# Patient Record
Sex: Female | Born: 1955
Health system: Southern US, Community
[De-identification: ages and names within clinical notes are randomized; demographics above are authoritative.]

## PROBLEM LIST (undated history)

## (undated) DIAGNOSIS — T7840XA Allergy, unspecified, initial encounter: Secondary | ICD-10-CM

## (undated) DIAGNOSIS — G4730 Sleep apnea, unspecified: Secondary | ICD-10-CM

## (undated) DIAGNOSIS — M1990 Unspecified osteoarthritis, unspecified site: Secondary | ICD-10-CM

## (undated) DIAGNOSIS — H409 Unspecified glaucoma: Secondary | ICD-10-CM

## (undated) DIAGNOSIS — E739 Lactose intolerance, unspecified: Secondary | ICD-10-CM

## (undated) DIAGNOSIS — I10 Essential (primary) hypertension: Secondary | ICD-10-CM

## (undated) DIAGNOSIS — I519 Heart disease, unspecified: Secondary | ICD-10-CM

## (undated) DIAGNOSIS — H269 Unspecified cataract: Secondary | ICD-10-CM

## (undated) DIAGNOSIS — C569 Malignant neoplasm of unspecified ovary: Secondary | ICD-10-CM

## (undated) DIAGNOSIS — K219 Gastro-esophageal reflux disease without esophagitis: Secondary | ICD-10-CM

## (undated) DIAGNOSIS — R011 Cardiac murmur, unspecified: Secondary | ICD-10-CM

## (undated) HISTORY — DX: Unspecified osteoarthritis, unspecified site: M19.90

## (undated) HISTORY — DX: Gastro-esophageal reflux disease without esophagitis: K21.9

## (undated) HISTORY — DX: Cardiac murmur, unspecified: R01.1

## (undated) HISTORY — DX: Allergy, unspecified, initial encounter: T78.40XA

## (undated) HISTORY — DX: Unspecified cataract: H26.9

## (undated) HISTORY — DX: Lactose intolerance, unspecified: E73.9

## (undated) HISTORY — PX: OTHER SURGICAL HISTORY: SHX169

## (undated) HISTORY — DX: Heart disease, unspecified: I51.9

## (undated) HISTORY — DX: Unspecified glaucoma: H40.9

## (undated) HISTORY — DX: Sleep apnea, unspecified: G47.30

## (undated) HISTORY — PX: COLONOSCOPY: SHX174

---

## 2014-08-12 ENCOUNTER — Encounter (HOSPITAL_COMMUNITY): Payer: Self-pay | Admitting: Adult Health

## 2014-08-12 ENCOUNTER — Emergency Department (HOSPITAL_COMMUNITY): Payer: Self-pay

## 2014-08-12 ENCOUNTER — Emergency Department (HOSPITAL_COMMUNITY)
Admission: EM | Admit: 2014-08-12 | Discharge: 2014-08-13 | Disposition: A | Discharge status: Home / Self Care | Payer: Self-pay | Attending: Emergency Medicine | Admitting: Emergency Medicine

## 2014-08-12 DIAGNOSIS — S8991XA Unspecified injury of right lower leg, initial encounter: Secondary | ICD-10-CM | POA: Insufficient documentation

## 2014-08-12 DIAGNOSIS — M25561 Pain in right knee: Secondary | ICD-10-CM

## 2014-08-12 DIAGNOSIS — Z8543 Personal history of malignant neoplasm of ovary: Secondary | ICD-10-CM | POA: Insufficient documentation

## 2014-08-12 DIAGNOSIS — W19XXXA Unspecified fall, initial encounter: Secondary | ICD-10-CM

## 2014-08-12 DIAGNOSIS — S20211A Contusion of right front wall of thorax, initial encounter: Secondary | ICD-10-CM | POA: Insufficient documentation

## 2014-08-12 DIAGNOSIS — Y9389 Activity, other specified: Secondary | ICD-10-CM | POA: Insufficient documentation

## 2014-08-12 DIAGNOSIS — I10 Essential (primary) hypertension: Secondary | ICD-10-CM | POA: Insufficient documentation

## 2014-08-12 DIAGNOSIS — Y998 Other external cause status: Secondary | ICD-10-CM | POA: Insufficient documentation

## 2014-08-12 DIAGNOSIS — Y92009 Unspecified place in unspecified non-institutional (private) residence as the place of occurrence of the external cause: Secondary | ICD-10-CM | POA: Insufficient documentation

## 2014-08-12 DIAGNOSIS — W01198A Fall on same level from slipping, tripping and stumbling with subsequent striking against other object, initial encounter: Secondary | ICD-10-CM | POA: Insufficient documentation

## 2014-08-12 HISTORY — DX: Essential (primary) hypertension: I10

## 2014-08-12 HISTORY — DX: Malignant neoplasm of unspecified ovary: C56.9

## 2014-08-12 LAB — CBC WITH DIFFERENTIAL/PLATELET
BASOS ABS: 0 10*3/uL (ref 0.0–0.1)
Basophils Relative: 0 % (ref 0–1)
Eosinophils Absolute: 0.2 10*3/uL (ref 0.0–0.7)
Eosinophils Relative: 3 % (ref 0–5)
HEMATOCRIT: 37.8 % (ref 36.0–46.0)
Hemoglobin: 12.3 g/dL (ref 12.0–15.0)
LYMPHS PCT: 50 % — AB (ref 12–46)
Lymphs Abs: 3.2 10*3/uL (ref 0.7–4.0)
MCH: 30.1 pg (ref 26.0–34.0)
MCHC: 32.5 g/dL (ref 30.0–36.0)
MCV: 92.6 fL (ref 78.0–100.0)
MONO ABS: 0.5 10*3/uL (ref 0.1–1.0)
MONOS PCT: 8 % (ref 3–12)
Neutro Abs: 2.5 10*3/uL (ref 1.7–7.7)
Neutrophils Relative %: 39 % — ABNORMAL LOW (ref 43–77)
PLATELETS: 254 10*3/uL (ref 150–400)
RBC: 4.08 MIL/uL (ref 3.87–5.11)
RDW: 12.6 % (ref 11.5–15.5)
WBC: 6.5 10*3/uL (ref 4.0–10.5)

## 2014-08-12 MED ORDER — HYDROCODONE-ACETAMINOPHEN 5-325 MG PO TABS
2.0000 | ORAL_TABLET | Freq: Once | ORAL | Status: AC
Start: 2014-08-13 — End: 2014-08-12
  Administered 2014-08-12: 2 via ORAL
  Filled 2014-08-12: qty 2

## 2014-08-12 MED ORDER — AMLODIPINE BESYLATE 5 MG PO TABS
5.0000 mg | ORAL_TABLET | Freq: Once | ORAL | Status: AC
  Administered 2014-08-12: 5 mg via ORAL
  Filled 2014-08-12: qty 1

## 2014-08-12 MED ORDER — HYDROCHLOROTHIAZIDE 25 MG PO TABS
25.0000 mg | ORAL_TABLET | ORAL | Status: AC
  Administered 2014-08-12: 25 mg via ORAL
  Filled 2014-08-12: qty 1

## 2014-08-12 NOTE — ED Notes (Signed)
Presents post fall from [place] states she tripped and fell and landed on her right side and her right leg went underneath her. She c/o worsening pain in right knee and right rib cage, especially with cough, laugh and deep breathing. Chest expansion symmetrical, edema noted to right knee. Pt is HTN at 209/99.

## 2014-08-12 NOTE — ED Provider Notes (Signed)
CSN: [id number]     Arrival date & time 08/12/14  2209 History  This chart was scribed for Kalman Drape, MD by Delphia Grates, ED Scribe. This patient was seen in room D33C/D33C and the patient's care was started at 11:39 PM.    Chief Complaint  Patient presents with  . Fall    The history is provided by the patient. No language interpreter was used.     HPI Comments: Lilu Mcglown is a 59 y.o. female who presents to the Emergency Department complaining of fall that occurred 3 days ago. Patient states she tripped and fell, landing on her right side. She denies head injury or LOC. She notes associated pain to right knee and right ribs. Patient is also complaining of SOB. She denies any other injuries or history of asthma. She has history of HTN and has a triage BP of 209/99 and 236/100. Patient states she has not seen a PCP in 2 years, and is noncompliant with her BP medications.    Past Medical History  Diagnosis Date  . Hypertension   . Ovary cancer    History reviewed. No pertinent past surgical history. History reviewed. No pertinent family history. History  Substance Use Topics  . Smoking status: Never Smoker   . Smokeless tobacco: Not on file  . Alcohol Use: No   OB History    No data available     Review of Systems  Respiratory: Positive for shortness of breath.   Musculoskeletal: Positive for myalgias and arthralgias.  All other systems reviewed and are negative.     Allergies  Review of patient's allergies indicates no known allergies.  Home Medications   Prior to Admission medications   Not on File   Triage Vitals: BP 209/99 mmHg  Pulse 75  Temp(Src) 97.9 F (36.6 C) (Oral)  Resp 18  SpO2 98%  Physical Exam  Constitutional: She is oriented to person, place, and time. She appears well-developed and well-nourished. No distress.  HENT:  Head: Normocephalic and atraumatic.  Nose: Nose normal.  Mouth/Throat: Oropharynx is clear and moist.  Eyes:  Conjunctivae and EOM are normal. Pupils are equal, round, and reactive to light.  Neck: Normal range of motion. Neck supple. No JVD present. No tracheal deviation present. No thyromegaly present.  Cardiovascular: Normal rate, regular rhythm, normal heart sounds and intact distal pulses.  Exam reveals no gallop and no friction rub.   No murmur heard. Pulmonary/Chest: Effort normal and breath sounds normal. No stridor. No respiratory distress. She has no wheezes. She has no rales. She exhibits tenderness (tenderness along right axillar line without step-off or crepitus or bruising).  Patient with expiratory wheezes when laying flat on the bed, when sitting up, deep breathing, wheezing has resolved.  Abdominal: Soft. Bowel sounds are normal. She exhibits no distension and no mass. There is no tenderness. There is no rebound and no guarding.  Musculoskeletal: Normal range of motion. She exhibits tenderness (tenderness to palpation over inferior pole of patella.  Small effusion.  No crepitus, no varus or valgus laxity, negative anterior posterior drawer.  No contusion noted.  Negative Lachman's). She exhibits no edema.  Lymphadenopathy:    She has no cervical adenopathy.  Neurological: She is alert and oriented to person, place, and time. She displays normal reflexes. She exhibits normal muscle tone. Coordination normal.  Skin: Skin is warm and dry. No rash noted. No erythema. No pallor.  Psychiatric: She has a normal mood and affect. Her behavior is  normal. Judgment and thought content normal.  Nursing note and vitals reviewed.   ED Course  Procedures (including critical care time)  DIAGNOSTIC STUDIES: Oxygen Saturation is 98% on room air, normal by my interpretation.    COORDINATION OF CARE: At 2346 Discussed treatment plan with patient which includes pain medication. Patient agrees.   Labs Review Labs Reviewed  BASIC METABOLIC PANEL - Abnormal; Notable for the following:    Glucose, Bld 114  (*)    GFR calc non Af Amer 63 (*)    GFR calc Af Amer 74 (*)    Anion gap 4 (*)    All other components within normal limits  CBC WITH DIFFERENTIAL/PLATELET - Abnormal; Notable for the following:    Neutrophils Relative % 39 (*)    Lymphocytes Relative 50 (*)    All other components within normal limits  URINALYSIS, ROUTINE W REFLEX MICROSCOPIC    Imaging Review Dg Chest 2 View  08/12/2014   CLINICAL DATA:  Status post fall 4 days ago. Right axillary and rib pain.  EXAM: CHEST  2 VIEW  COMPARISON:  None.  FINDINGS: The heart size and mediastinal contours are within normal limits. Both lungs are clear. The visualized skeletal structures are unremarkable.  IMPRESSION: No active cardiopulmonary disease.   Electronically Signed   By: Kathreen Devoid   On: 08/12/2014 22:52   Dg Knee Complete 4 Views Right  08/12/2014   CLINICAL DATA:  Status post fall, right knee pain  EXAM: RIGHT KNEE - COMPLETE 4+ VIEW  COMPARISON:  None.  FINDINGS: No acute fracture or dislocation. No lytic or sclerotic osseous lesion. Mild osteoarthritis of the patellofemoral compartment and medial femorotibial compartment. No joint effusion.  IMPRESSION: No acute osseous injury of the right knee.   Electronically Signed   By: Kathreen Devoid   On: 08/12/2014 22:53     EKG Interpretation   Date/Time:  Tuesday August 12 2014 23:34:53 EST Ventricular Rate:  58 PR Interval:  146 QRS Duration: 76 QT Interval:  415 QTC Calculation: 408 R Axis:   49 Text Interpretation:  Sinus rhythm Abnormal R-wave progression, early  transition Abnormal T, consider ischemia, diffuse leads No old tracing to  compare Confirmed by Zackory Pudlo  MD, Roel Douthat ([id number]) on 08/13/2014 12:06:11 AM      MDM   Final diagnoses:  Fall at home, initial encounter  Knee pain, acute, right  Chest wall contusion, right, initial encounter  Essential hypertension    59 year old female with mechanical fall on Saturday with persistent right knee and right lateral  chest wall tenderness.  Patient has history of hypertension, has been noncompliant with medication for some time.  She denies any chest pain.  She does have some shortness of breath secondary to pain with deep breathing.  EKG unremarkable.  Labs pending.  Plan for pain medicine and restarted on prior hypertension medications. I personally performed the services described in this documentation, which was scribed in my presence. The recorded information has been reviewed and is accurate.     Kalman Drape, MD 08/13/14 [phone]

## 2014-08-12 NOTE — ED Notes (Addendum)
Pt reports falling Saturday; residual pain in right ribs and right knee. Pt states she has been able to ambulate without any difficulties. Pt denies LOC after fall.

## 2014-08-13 LAB — URINALYSIS, ROUTINE W REFLEX MICROSCOPIC
BILIRUBIN URINE: NEGATIVE
Glucose, UA: NEGATIVE mg/dL
Hgb urine dipstick: NEGATIVE
Ketones, ur: NEGATIVE mg/dL
Leukocytes, UA: NEGATIVE
Nitrite: NEGATIVE
PH: 6.5 (ref 5.0–8.0)
PROTEIN: NEGATIVE mg/dL
Specific Gravity, Urine: 1.021 (ref 1.005–1.030)
Urobilinogen, UA: 1 mg/dL (ref 0.0–1.0)

## 2014-08-13 LAB — BASIC METABOLIC PANEL
Anion gap: 4 — ABNORMAL LOW (ref 5–15)
BUN: 15 mg/dL (ref 6–23)
CALCIUM: 9.1 mg/dL (ref 8.4–10.5)
CO2: 28 mmol/L (ref 19–32)
Chloride: 107 mmol/L (ref 96–112)
Creatinine, Ser: 0.96 mg/dL (ref 0.50–1.10)
GFR calc non Af Amer: 63 mL/min — ABNORMAL LOW (ref >90)
GFR, EST AFRICAN AMERICAN: 74 mL/min — AB (ref >90)
GLUCOSE: 114 mg/dL — AB (ref 70–99)
Potassium: 4.1 mmol/L (ref 3.5–5.1)
SODIUM: 139 mmol/L (ref 135–145)

## 2014-08-13 MED ORDER — HYDROCODONE-ACETAMINOPHEN 5-325 MG PO TABS: 1.0000 | ORAL_TABLET | ORAL | Status: DC | PRN

## 2014-08-13 MED ORDER — AMLODIPINE BESYLATE 5 MG PO TABS: 5.0000 mg | ORAL_TABLET | Freq: Every day | ORAL | Status: DC

## 2014-08-13 MED ORDER — HYDROCHLOROTHIAZIDE 25 MG PO TABS: 25.0000 mg | ORAL_TABLET | Freq: Every day | ORAL | Status: DC

## 2014-08-13 MED ORDER — CLONIDINE HCL 0.1 MG PO TABS
0.1000 mg | ORAL_TABLET | Freq: Once | ORAL | Status: AC
  Administered 2014-08-13: 0.1 mg via ORAL
  Filled 2014-08-13: qty 1

## 2014-08-13 NOTE — Discharge Instructions (Signed)
Take medication as prescribed.  Please follow-up with a local primary care doctor who can manage your blood pressure.  It is very important for you to take your blood pressure medication to avoid complications of high blood pressure, such as heart attack, stroke, kidney failure.   Chest Contusion A contusion is a deep bruise. Bruises happen when an injury causes bleeding under the skin. Signs of bruising include pain, puffiness (swelling), and discolored skin. The bruise may turn blue, purple, or yellow.  HOME CARE  Put ice on the injured area.  Put ice in a plastic bag.  Place a towel between the skin and the bag.  Leave the ice on for 15-20 minutes at a time, 03-04 times a day for the first 48 hours.  Only take medicine as told by your doctor.  Rest.  Take deep breaths (deep-breathing exercises) as told by your doctor.  Stop smoking if you smoke.  Do not lift objects over 5 pounds (2.3 kilograms) for 3 days or longer if told by your doctor. GET HELP RIGHT AWAY IF:   You have more bruising or puffiness.  You have pain that gets worse.  You have trouble breathing.  You are dizzy, weak, or pass out (faint).  You have blood in your pee (urine) or poop (stool).  You cough up or throw up (vomit) blood.  Your puffiness or pain is not helped with medicines. MAKE SURE YOU:   Understand these instructions.  Will watch your condition.  Will get help right away if you are not doing well or get worse. Document Released: 12/14/2007 Document Revised: 03/21/2012 Document Reviewed: 12/19/2011 [facility] Patient Information 2015 [place], [place]. This information is not intended to replace advice given to you by your health care provider. Make sure you discuss any questions you have with your health care provider.  DASH Eating Plan DASH stands for "Dietary Approaches to Stop Hypertension." The DASH eating plan is a healthy eating plan that has been shown to reduce high blood pressure  (hypertension). Additional health benefits may include reducing the risk of type 2 diabetes mellitus, heart disease, and stroke. The DASH eating plan may also help with weight loss. WHAT DO I NEED TO KNOW ABOUT THE DASH EATING PLAN? For the DASH eating plan, you will follow these general guidelines:  Choose foods with a percent daily value for sodium of less than 5% (as listed on the food label).  Use salt-free seasonings or herbs instead of table salt or sea salt.  Check with your health care provider or pharmacist before using salt substitutes.  Eat lower-sodium products, often labeled as "lower sodium" or "no salt added."  Eat fresh foods.  Eat more vegetables, fruits, and low-fat dairy products.  Choose whole grains. Look for the word "whole" as the first word in the ingredient list.  Choose fish and skinless chicken or [place] more often than red meat. Limit fish, poultry, and meat to 6 oz (170 g) each day.  Limit sweets, desserts, sugars, and sugary drinks.  Choose heart-healthy fats.  Limit cheese to 1 oz (28 g) per day.  Eat more home-cooked food and less restaurant, buffet, and fast food.  Limit fried foods.  Cook foods using methods other than frying.  Limit canned vegetables. If you do use them, rinse them well to decrease the sodium.  When eating at a restaurant, ask that your food be prepared with less salt, or no salt if possible. WHAT FOODS CAN I EAT? Seek help from  a dietitian for individual calorie needs. Grains Whole grain or whole wheat bread. Brown rice. Whole grain or whole wheat pasta. Quinoa, bulgur, and whole grain cereals. Low-sodium cereals. Corn or whole wheat flour tortillas. Whole grain cornbread. Whole grain crackers. Low-sodium crackers. Vegetables Fresh or frozen vegetables (raw, steamed, roasted, or grilled). Low-sodium or reduced-sodium tomato and vegetable juices. Low-sodium or reduced-sodium tomato sauce and paste. Low-sodium or  reduced-sodium canned vegetables.  Fruits All fresh, canned (in natural juice), or frozen fruits. Meat and Other Protein Products Ground beef (85% or leaner), grass-fed beef, or beef trimmed of fat. Skinless chicken or [place]. Ground chicken or [place]. Pork trimmed of fat. All fish and seafood. Eggs. Dried beans, peas, or lentils. Unsalted nuts and seeds. Unsalted canned beans. Dairy Low-fat dairy products, such as skim or 1% milk, 2% or reduced-fat cheeses, low-fat ricotta or cottage cheese, or plain low-fat yogurt. Low-sodium or reduced-sodium cheeses. Fats and Oils Tub margarines without trans fats. Light or reduced-fat mayonnaise and salad dressings (reduced sodium). Avocado. Safflower, olive, or canola oils. Natural peanut or almond butter. Other Unsalted popcorn and pretzels. The items listed above may not be a complete list of recommended foods or beverages. Contact your dietitian for more options. WHAT FOODS ARE NOT RECOMMENDED? Grains White bread. White pasta. White rice. Refined cornbread. Bagels and croissants. Crackers that contain trans fat. Vegetables Creamed or fried vegetables. Vegetables in a cheese sauce. Regular canned vegetables. Regular canned tomato sauce and paste. Regular tomato and vegetable juices. Fruits Dried fruits. Canned fruit in light or heavy syrup. Fruit juice. Meat and Other Protein Products Fatty cuts of meat. Ribs, chicken wings, bacon, sausage, bologna, salami, chitterlings, fatback, hot dogs, bratwurst, and packaged luncheon meats. Salted nuts and seeds. Canned beans with salt. Dairy Whole or 2% milk, cream, half-and-half, and cream cheese. Whole-fat or sweetened yogurt. Full-fat cheeses or blue cheese. Nondairy creamers and whipped toppings. Processed cheese, cheese spreads, or cheese curds. Condiments Onion and garlic salt, seasoned salt, table salt, and sea salt. Canned and packaged gravies. Worcestershire sauce. Tartar sauce. Barbecue sauce. Teriyaki  sauce. Soy sauce, including reduced sodium. Steak sauce. Fish sauce. Oyster sauce. Cocktail sauce. Horseradish. Ketchup and mustard. Meat flavorings and tenderizers. Bouillon cubes. Hot sauce. Tabasco sauce. Marinades. Taco seasonings. Relishes. Fats and Oils Butter, stick margarine, lard, shortening, ghee, and bacon fat. Coconut, palm kernel, or palm oils. Regular salad dressings. Other Pickles and olives. Salted popcorn and pretzels. The items listed above may not be a complete list of foods and beverages to avoid. Contact your dietitian for more information. WHERE CAN I FIND MORE INFORMATION? [employer], [employer], [employer]: travelstabloid.com Document Released: 06/16/2011 Document Revised: 11/11/2013 Document Reviewed: 05/01/2013 [facility] Patient Information 2015 [place], [place]. This information is not intended to replace advice given to you by your health care provider. Make sure you discuss any questions you have with your health care provider.  Fall Prevention and Home Safety Falls cause injuries and can affect all age groups. It is possible to use preventive measures to significantly decrease the likelihood of falls. There are many simple measures which can make your home safer and prevent falls. OUTDOORS  Repair cracks and edges of walkways and driveways.  Remove high doorway thresholds.  Trim shrubbery on the main path into your home.  Have good outside lighting.  Clear walkways of tools, rocks, debris, and clutter.  Check that handrails are not broken and are securely fastened. Both sides of steps should have handrails.  Have leaves,  snow, and ice cleared regularly.  Use sand or salt on walkways during winter months.  In the garage, clean up grease or oil spills. BATHROOM  Install night lights.  Install grab bars by the toilet and in the tub and shower.  Use non-skid mats or decals in the tub or shower.  Place a plastic  non-slip stool in the shower to sit on, if needed.  Keep floors dry and clean up all water on the floor immediately.  Remove soap buildup in the tub or shower on a regular basis.  Secure bath mats with non-slip, double-sided rug tape.  Remove throw rugs and tripping hazards from the floors. BEDROOMS  Install night lights.  Make sure a bedside light is easy to reach.  Do not use oversized bedding.  Keep a telephone by your bedside.  Have a firm chair with side arms to use for getting dressed.  Remove throw rugs and tripping hazards from the floor. KITCHEN  Keep handles on pots and pans turned toward the center of the stove. Use back burners when possible.  Clean up spills quickly and allow time for drying.  Avoid walking on wet floors.  Avoid hot utensils and knives.  Position shelves so they are not too high or low.  Place commonly used objects within easy reach.  If necessary, use a sturdy step stool with a grab bar when reaching.  Keep electrical cables out of the way.  Do not use floor polish or wax that makes floors slippery. If you must use wax, use non-skid floor wax.  Remove throw rugs and tripping hazards from the floor. STAIRWAYS  Never leave objects on stairs.  Place handrails on both sides of stairways and use them. Fix any loose handrails. Make sure handrails on both sides of the stairways are as long as the stairs.  Check carpeting to make sure it is firmly attached along stairs. Make repairs to worn or loose carpet promptly.  Avoid placing throw rugs at the top or bottom of stairways, or properly secure the rug with carpet tape to prevent slippage. Get rid of throw rugs, if possible.  Have an electrician put in a light switch at the top and bottom of the stairs. OTHER FALL PREVENTION TIPS  Wear low-heel or rubber-soled shoes that are supportive and fit well. Wear closed toe shoes.  When using a stepladder, make sure it is fully opened and both  spreaders are firmly locked. Do not climb a closed stepladder.  Add color or contrast paint or tape to grab bars and handrails in your home. Place contrasting color strips on first and last steps.  Learn and use mobility aids as needed. Install an electrical emergency response system.  Turn on lights to avoid dark areas. Replace light bulbs that burn out immediately. Get light switches that glow.  Arrange furniture to create clear pathways. Keep furniture in the same place.  Firmly attach carpet with non-skid or double-sided tape.  Eliminate uneven floor surfaces.  Select a carpet pattern that does not visually hide the edge of steps.  Be aware of all pets. OTHER HOME SAFETY TIPS  Set the water temperature for 120 F (48.8 C).  Keep emergency numbers on or near the telephone.  Keep smoke detectors on every level of the home and near sleeping areas. Document Released: 06/17/2002 Document Revised: 12/27/2011 Document Reviewed: 09/16/2011 [facility] Patient Information 2015 [place], [place]. This information is not intended to replace advice given to you by your health care  provider. Make sure you discuss any questions you have with your health care provider.  Hypertension Hypertension is another name for high blood pressure. High blood pressure forces your heart to work harder to pump blood. A blood pressure reading has two numbers, which includes a higher number over a lower number (example: 110/72). HOME CARE   Have your blood pressure rechecked by your doctor.  Only take medicine as told by your doctor. Follow the directions carefully. The medicine does not work as well if you skip doses. Skipping doses also puts you at risk for problems.  Do not smoke.  Monitor your blood pressure at home as told by your doctor. GET HELP IF:  You think you are having a reaction to the medicine you are taking.  You have repeat headaches or feel dizzy.  You have puffiness (swelling) in your  ankles.  You have trouble with your vision. GET HELP RIGHT AWAY IF:   You get a very bad headache and are confused.  You feel weak, numb, or faint.  You get chest or belly (abdominal) pain.  You throw up (vomit).  You cannot breathe very well. MAKE SURE YOU:   Understand these instructions.  Will watch your condition.  Will get help right away if you are not doing well or get worse. Document Released: 12/14/2007 Document Revised: 07/02/2013 Document Reviewed: 04/19/2013 [facility] Patient Information 2015 [place], [place]. This information is not intended to replace advice given to you by your health care provider. Make sure you discuss any questions you have with your health care provider.  Knee Pain Knee pain can be a result of an injury or other medical conditions. Treatment will depend on the cause of your pain. HOME CARE  Only take medicine as told by your doctor.  Keep a healthy weight. Being overweight can make the knee hurt more.  Stretch before exercising or playing sports.  If there is constant knee pain, change the way you exercise. Ask your doctor for advice.  Make sure shoes fit well. Choose the right shoe for the sport or activity.  Protect your knees. Wear kneepads if needed.  Rest when you are tired. GET HELP RIGHT AWAY IF:   Your knee pain does not stop.  Your knee pain does not get better.  Your knee joint feels hot to the touch.  You have a fever. MAKE SURE YOU:   Understand these instructions.  Will watch this condition.  Will get help right away if you are not doing well or get worse. Document Released: 09/23/2008 Document Revised: 09/19/2011 Document Reviewed: 09/23/2008 [facility] Patient Information 2015 [place], [place]. This information is not intended to replace advice given to you by your health care provider. Make sure you discuss any questions you have with your health care provider.  Managing Your High Blood Pressure Blood pressure  is a measurement of how forceful your blood is pressing against the walls of the arteries. Arteries are muscular tubes within the circulatory system. Blood pressure does not stay the same. Blood pressure rises when you are active, excited, or nervous; and it lowers during sleep and relaxation. If the numbers measuring your blood pressure stay above normal most of the time, you are at risk for health problems. High blood pressure (hypertension) is a long-term (chronic) condition in which blood pressure is elevated. A blood pressure reading is recorded as two numbers, such as 120 over 80 (or 120/80). The first, higher number is called the systolic pressure. It is a  measure of the pressure in your arteries as the heart beats. The second, lower number is called the diastolic pressure. It is a measure of the pressure in your arteries as the heart relaxes between beats.  Keeping your blood pressure in a normal range is important to your overall health and prevention of health problems, such as heart disease and stroke. When your blood pressure is uncontrolled, your heart has to work harder than normal. High blood pressure is a very common condition in adults because blood pressure tends to rise with age. Men and women are equally likely to have hypertension but at different times in life. Before age 58, men are more likely to have hypertension. After 60 years of age, women are more likely to have it. Hypertension is especially common in African Americans. This condition often has no signs or symptoms. The cause of the condition is usually not known. Your caregiver can help you come up with a plan to keep your blood pressure in a normal, healthy range. BLOOD PRESSURE STAGES Blood pressure is classified into four stages: normal, prehypertension, stage 1, and stage 2. Your blood pressure reading will be used to determine what type of treatment, if any, is necessary. Appropriate treatment options are tied to these four  stages:  Normal  Systolic pressure (mm Hg): below 120.  Diastolic pressure (mm Hg): below 80. Prehypertension  Systolic pressure (mm Hg): 120 to 139.  Diastolic pressure (mm Hg): 80 to 89. Stage1  Systolic pressure (mm Hg): 140 to 159.  Diastolic pressure (mm Hg): 90 to 99. Stage2  Systolic pressure (mm Hg): 160 or above.  Diastolic pressure (mm Hg): 100 or above. RISKS RELATED TO HIGH BLOOD PRESSURE Managing your blood pressure is an important responsibility. Uncontrolled high blood pressure can lead to:  A heart attack.  A stroke.  A weakened blood vessel (aneurysm).  Heart failure.  Kidney damage.  Eye damage.  Metabolic syndrome.  Memory and concentration problems. HOW TO MANAGE YOUR BLOOD PRESSURE Blood pressure can be managed effectively with lifestyle changes and medicines (if needed). Your caregiver will help you come up with a plan to bring your blood pressure within a normal range. Your plan should include the following: Education  Read all information provided by your caregivers about how to control blood pressure.  Educate yourself on the latest guidelines and treatment recommendations. New research is always being done to further define the risks and treatments for high blood pressure. Lifestylechanges  Control your weight.  Avoid smoking.  Stay physically active.  Reduce the amount of salt in your diet.  Reduce stress.  Control any chronic conditions, such as high cholesterol or diabetes.  Reduce your alcohol intake. Medicines  Several medicines (antihypertensive medicines) are available, if needed, to bring blood pressure within a normal range. Communication  Review all the medicines you take with your caregiver because there may be side effects or interactions.  Talk with your caregiver about your diet, exercise habits, and other lifestyle factors that may be contributing to high blood pressure.  See your caregiver regularly.  Your caregiver can help you create and adjust your plan for managing high blood pressure. RECOMMENDATIONS FOR TREATMENT AND FOLLOW-UP  The following recommendations are based on current guidelines for managing high blood pressure in nonpregnant adults. Use these recommendations to identify the proper follow-up period or treatment option based on your blood pressure reading. You can discuss these options with your caregiver.  Systolic pressure of 194 to 174 or diastolic  pressure of 80 to 89: Follow up with your caregiver as directed.  Systolic pressure of 502 to 774 or diastolic pressure of 90 to 100: Follow up with your caregiver within 2 months.  Systolic pressure above 128 or diastolic pressure above 786: Follow up with your caregiver within 1 month.  Systolic pressure above 767 or diastolic pressure above 209: Consider antihypertensive therapy; follow up with your caregiver within 1 week.  Systolic pressure above 470 or diastolic pressure above 962: Begin antihypertensive therapy; follow up with your caregiver within 1 week. Document Released: 03/21/2012 Document Reviewed: 03/21/2012 [facility] Patient Information 2015 [place]. This information is not intended to replace advice given to you by your health care provider. Make sure you discuss any questions you have with your health care provider.

## 2016-11-02 ENCOUNTER — Emergency Department (HOSPITAL_COMMUNITY): Payer: Self-pay

## 2016-11-02 ENCOUNTER — Emergency Department (HOSPITAL_COMMUNITY)
Admission: EM | Admit: 2016-11-02 | Discharge: 2016-11-03 | Disposition: A | Discharge status: Home / Self Care | Payer: Self-pay | Attending: Emergency Medicine | Admitting: Emergency Medicine

## 2016-11-02 ENCOUNTER — Encounter (HOSPITAL_COMMUNITY): Payer: Self-pay | Admitting: *Deleted

## 2016-11-02 DIAGNOSIS — R55 Syncope and collapse: Secondary | ICD-10-CM | POA: Insufficient documentation

## 2016-11-02 DIAGNOSIS — R519 Headache, unspecified: Secondary | ICD-10-CM

## 2016-11-02 DIAGNOSIS — R51 Headache: Secondary | ICD-10-CM | POA: Insufficient documentation

## 2016-11-02 LAB — CBG MONITORING, ED: Glucose-Capillary: 104 mg/dL — ABNORMAL HIGH (ref 65–99)

## 2016-11-02 LAB — BASIC METABOLIC PANEL
ANION GAP: 8 (ref 5–15)
BUN: 11 mg/dL (ref 6–20)
CO2: 26 mmol/L (ref 22–32)
Calcium: 9.3 mg/dL (ref 8.9–10.3)
Chloride: 103 mmol/L (ref 101–111)
Creatinine, Ser: 0.86 mg/dL (ref 0.44–1.00)
GFR calc Af Amer: >60 mL/min (ref >60)
GFR calc non Af Amer: >60 mL/min (ref >60)
Glucose, Bld: 107 mg/dL — ABNORMAL HIGH (ref 65–99)
Potassium: 3.7 mmol/L (ref 3.5–5.1)
Sodium: 137 mmol/L (ref 135–145)

## 2016-11-02 LAB — CBC
HCT: 41.4 % (ref 36.0–46.0)
HEMOGLOBIN: 13.5 g/dL (ref 12.0–15.0)
MCH: 30.6 pg (ref 26.0–34.0)
MCHC: 32.6 g/dL (ref 30.0–36.0)
MCV: 93.9 fL (ref 78.0–100.0)
Platelets: 248 10*3/uL (ref 150–400)
RBC: 4.41 MIL/uL (ref 3.87–5.11)
RDW: 12.4 % (ref 11.5–15.5)
WBC: 7.9 10*3/uL (ref 4.0–10.5)

## 2016-11-02 LAB — URINALYSIS, ROUTINE W REFLEX MICROSCOPIC
Bilirubin Urine: NEGATIVE
Glucose, UA: NEGATIVE mg/dL
Hgb urine dipstick: NEGATIVE
KETONES UR: NEGATIVE mg/dL
Nitrite: NEGATIVE
PROTEIN: 30 mg/dL — AB
Specific Gravity, Urine: 1.013 (ref 1.005–1.030)
pH: 5 (ref 5.0–8.0)

## 2016-11-02 LAB — I-STAT TROPONIN, ED: Troponin i, poc: 0.01 ng/mL (ref 0.00–0.08)

## 2016-11-02 MED ORDER — LORAZEPAM 1 MG PO TABS
1.0000 mg | ORAL_TABLET | Freq: Once | ORAL | Status: AC
  Administered 2016-11-02: 1 mg via ORAL
  Filled 2016-11-02: qty 1

## 2016-11-02 NOTE — ED Provider Notes (Signed)
[facility] DEPT Provider Note   CSN: [id number] Arrival date & time: 11/02/16  1804     History   Chief Complaint Chief Complaint  Patient presents with  . Near Syncope    HPI Renee Kelly is a 61 y.o. female.  Patient took her antihypertensive medication just prior to arrival. No plans on intervention, we'll monitor for resolution.   The history is provided by the patient.  Near Syncope  This is a new problem. The current episode started less than 1 hour ago. The problem occurs constantly. The problem has been rapidly improving. Associated symptoms include headaches (left sided). Pertinent negatives include no chest pain, no abdominal pain and no shortness of breath. Nothing aggravates the symptoms. Nothing relieves the symptoms. She has tried nothing for the symptoms.    Past Medical History:  Diagnosis Date  . Hypertension   . 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   History reviewed. No pertinent surgical history.  OB History    No data available       Home Medications    Prior to Admission medications   Medication Sig Start Date End Date Taking? Authorizing Provider  amLODipine (NORVASC) 5 MG tablet Take 1 tablet (5 mg total) by mouth daily. 08/13/14   Linton Flemings, MD  aspirin 325 MG tablet Take 325 mg by mouth every 6 (six) hours as needed for mild pain.    Historical Provider, MD  hydrochlorothiazide (HYDRODIURIL) 25 MG tablet Take 1 tablet (25 mg total) by mouth daily. 08/13/14   Linton Flemings, MD  HYDROcodone-acetaminophen (NORCO/VICODIN) 5-325 MG per tablet Take 1-2 tablets by mouth every 4 (four) hours as needed for moderate pain. 08/13/14   Linton Flemings, MD    Family History History reviewed. No pertinent family history.  Social History Social History  Substance Use Topics  . Smoking status: Never Smoker  . Smokeless tobacco: Not on file  . Alcohol use No     Allergies   Patient has no known  allergies.   Review of Systems Review of Systems  Respiratory: Negative for shortness of breath.   Cardiovascular: Positive for near-syncope. Negative for chest pain.  Gastrointestinal: Negative for abdominal pain.  Neurological: Positive for headaches (left sided).  All other systems reviewed and are negative.    Physical Exam Updated Vital Signs BP (!) 200/96   Pulse 60   Temp 98.8 F (37.1 C) (Oral)   Resp 17   Ht 5\' 3"  (1.6 m)   Wt 165 lb (74.8 kg)   SpO2 97%   BMI 29.23 kg/m   Physical Exam  Constitutional: She is oriented to person, place, and time. She appears well-developed and well-nourished. No distress.  HENT:  Head: Normocephalic.  Nose: Nose normal.  Eyes: Conjunctivae are normal.  Neck: Neck supple. No tracheal deviation present.  Cardiovascular: Normal rate, regular rhythm and normal heart sounds.   No murmur heard. Pulmonary/Chest: Effort normal and breath sounds normal. No respiratory distress.  Abdominal: Soft. She exhibits no distension.  Neurological: She is alert and oriented to person, place, and time. No cranial nerve deficit or sensory deficit. Coordination normal.  Skin: Skin is warm and dry.  Psychiatric: She has a normal mood and affect. Her behavior is normal.     ED Treatments / Results  Labs (all labs ordered are listed, but only abnormal results are displayed) Labs Reviewed  BASIC METABOLIC PANEL - Abnormal; Notable for the following:  Result Value   Glucose, Bld 107 (*)    All other components within normal limits  URINALYSIS, ROUTINE W REFLEX MICROSCOPIC - Abnormal; Notable for the following:    APPearance HAZY (*)    Protein, ur 30 (*)    Leukocytes, UA TRACE (*)    Bacteria, UA RARE (*)    Squamous Epithelial / LPF 0-5 (*)    All other components within normal limits  CBG MONITORING, ED - Abnormal; Notable for the following:    Glucose-Capillary 104 (*)    All other components within normal limits  CBC  I-STAT  TROPOININ, ED    EKG  EKG Interpretation  Date/Time:  Wednesday November 02 2016 19:07:37 EDT Ventricular Rate:  64 PR Interval:  136 QRS Duration: 74 QT Interval:  392 QTC Calculation: 404 R Axis:   38 Text Interpretation:  Normal sinus rhythm T wave abnormality, consider inferolateral ischemia No significant change since last tracing from 08/12/2014 Confirmed by Keryn Nessler MD, Mata Rowen [phone]) on 11/02/2016 8:32:08 PM       Radiology Mr Brain Wo Contrast  Result Date: 11/02/2016 CLINICAL DATA:  Left facial numbness and left arm numbness. Near syncope. EXAM: MRI HEAD WITHOUT CONTRAST TECHNIQUE: Multiplanar, multiecho pulse sequences of the brain and surrounding structures were obtained without intravenous contrast. COMPARISON:  None. FINDINGS: Brain: The midline structures are normal. No focal diffusion restriction to indicate acute infarct. No intraparenchymal hemorrhage. Cystic foci within both medial temporal lobes are likely benign neuroglial cysts. There is multifocal hyperintense T2-weighted signal within the periventricular and subcortical white matter, most often seen in the setting of chronic microvascular ischemia. No mass lesion. No chronic microhemorrhage or cerebral amyloid angiopathy. No hydrocephalus, age advanced atrophy or lobar predominant volume loss. No dural abnormality or extra-axial collection. Vascular: Major intracranial arterial and venous sinus flow voids are preserved. Skull and upper cervical spine: The visualized skull base, calvarium, upper cervical spine and extracranial soft tissues are normal. Sinuses/Orbits: No fluid levels or advanced mucosal thickening. No mastoid effusion. Normal orbits. IMPRESSION: Chronic microvascular ischemia without acute intracranial abnormality. Electronically Signed   By: Ulyses Jarred M.D.   On: 11/02/2016 22:27    Procedures Procedures (including critical care time)  Medications Ordered in ED Medications  LORazepam (ATIVAN) tablet 1 mg  (1 mg Oral Given 11/02/16 2140)     Initial Impression / Assessment and Plan / ED Course  I have reviewed the triage vital signs and the nursing notes.  Pertinent labs & imaging results that were available during my care of the patient were reviewed by me and considered in my medical decision making (see chart for details).     61 y.o. female presents with near syncopal episode today after having a left-sided headache while visiting another patient in the hospital. She is well-appearing and has a normal neurologic examination but she claims she has left arm weakness, numbness, and left facial numbness. Workup is negative for cardiac etiology, she is well-appearing but given her unilateral complaints and recent onset will pursue MR for rule out of acute stroke. MRI is negative, Plan to follow up with PCP as needed and return precautions discussed for worsening or new concerning symptoms.   Final Clinical Impressions(s) / ED Diagnoses   Final diagnoses:  Nonintractable headache, unspecified chronicity pattern, unspecified headache type  Vasovagal near syncope    New Prescriptions Discharge Medication List as of 11/02/2016 11:42 PM       Leo Grosser, MD 11/03/16 1239

## 2016-11-02 NOTE — ED Notes (Signed)
Patient transported to MRI 

## 2016-11-02 NOTE — ED Triage Notes (Addendum)
Pt reports being here to visit a patient, began feeling fatigued, warm all over and had near syncopal episode. Denies any pain. Hypertensive at triage.

## 2016-11-02 NOTE — ED Notes (Signed)
Advised dr that pt's pressure was 211/81. Dr stated pt could still go to MRI

## 2017-03-01 DIAGNOSIS — N3941 Urge incontinence: Secondary | ICD-10-CM | POA: Diagnosis not present

## 2017-03-01 DIAGNOSIS — I10 Essential (primary) hypertension: Secondary | ICD-10-CM | POA: Diagnosis not present

## 2017-03-01 DIAGNOSIS — R768 Other specified abnormal immunological findings in serum: Secondary | ICD-10-CM | POA: Diagnosis not present

## 2017-03-01 DIAGNOSIS — R413 Other amnesia: Secondary | ICD-10-CM | POA: Diagnosis not present

## 2017-03-01 DIAGNOSIS — Z7689 Persons encountering health services in other specified circumstances: Secondary | ICD-10-CM | POA: Diagnosis not present

## 2017-03-01 DIAGNOSIS — Z1159 Encounter for screening for other viral diseases: Secondary | ICD-10-CM | POA: Diagnosis not present

## 2017-03-01 DIAGNOSIS — Z9181 History of falling: Secondary | ICD-10-CM | POA: Diagnosis not present

## 2017-03-21 DIAGNOSIS — Z01419 Encounter for gynecological examination (general) (routine) without abnormal findings: Secondary | ICD-10-CM | POA: Diagnosis not present

## 2017-03-21 DIAGNOSIS — K1379 Other lesions of oral mucosa: Secondary | ICD-10-CM | POA: Diagnosis not present

## 2017-03-21 DIAGNOSIS — R430 Anosmia: Secondary | ICD-10-CM | POA: Diagnosis not present

## 2017-03-21 DIAGNOSIS — Z0000 Encounter for general adult medical examination without abnormal findings: Secondary | ICD-10-CM | POA: Diagnosis not present

## 2017-03-21 DIAGNOSIS — Z1231 Encounter for screening mammogram for malignant neoplasm of breast: Secondary | ICD-10-CM | POA: Diagnosis not present

## 2017-03-21 DIAGNOSIS — H539 Unspecified visual disturbance: Secondary | ICD-10-CM | POA: Diagnosis not present

## 2017-03-21 DIAGNOSIS — Z124 Encounter for screening for malignant neoplasm of cervix: Secondary | ICD-10-CM | POA: Diagnosis not present

## 2017-03-27 DIAGNOSIS — H35033 Hypertensive retinopathy, bilateral: Secondary | ICD-10-CM | POA: Diagnosis not present

## 2017-03-27 DIAGNOSIS — H2513 Age-related nuclear cataract, bilateral: Secondary | ICD-10-CM | POA: Diagnosis not present

## 2017-03-27 DIAGNOSIS — H524 Presbyopia: Secondary | ICD-10-CM | POA: Diagnosis not present

## 2017-03-27 DIAGNOSIS — H40023 Open angle with borderline findings, high risk, bilateral: Secondary | ICD-10-CM | POA: Diagnosis not present

## 2017-03-27 DIAGNOSIS — H04123 Dry eye syndrome of bilateral lacrimal glands: Secondary | ICD-10-CM | POA: Diagnosis not present

## 2017-04-04 DIAGNOSIS — Z1231 Encounter for screening mammogram for malignant neoplasm of breast: Secondary | ICD-10-CM | POA: Diagnosis not present

## 2017-04-14 DIAGNOSIS — H40023 Open angle with borderline findings, high risk, bilateral: Secondary | ICD-10-CM | POA: Diagnosis not present

## 2017-04-14 DIAGNOSIS — H2513 Age-related nuclear cataract, bilateral: Secondary | ICD-10-CM | POA: Diagnosis not present

## 2017-04-14 DIAGNOSIS — H04123 Dry eye syndrome of bilateral lacrimal glands: Secondary | ICD-10-CM | POA: Diagnosis not present

## 2017-04-14 DIAGNOSIS — H35033 Hypertensive retinopathy, bilateral: Secondary | ICD-10-CM | POA: Diagnosis not present

## 2017-05-09 ENCOUNTER — Telehealth: Payer: Self-pay | Admitting: Neurology

## 2017-05-09 ENCOUNTER — Ambulatory Visit (INDEPENDENT_AMBULATORY_CARE_PROVIDER_SITE_OTHER): Payer: Medicare HMO | Admitting: Neurology

## 2017-05-09 ENCOUNTER — Encounter: Payer: Self-pay | Admitting: Neurology

## 2017-05-09 VITALS — BP 187/94 | HR 59 | Wt 184.0 lb

## 2017-05-09 DIAGNOSIS — R29818 Other symptoms and signs involving the nervous system: Secondary | ICD-10-CM | POA: Diagnosis not present

## 2017-05-09 DIAGNOSIS — R7309 Other abnormal glucose: Secondary | ICD-10-CM | POA: Diagnosis not present

## 2017-05-09 DIAGNOSIS — R42 Dizziness and giddiness: Secondary | ICD-10-CM | POA: Diagnosis not present

## 2017-05-09 DIAGNOSIS — H93A9 Pulsatile tinnitus, unspecified ear: Secondary | ICD-10-CM | POA: Diagnosis not present

## 2017-05-09 DIAGNOSIS — E538 Deficiency of other specified B group vitamins: Secondary | ICD-10-CM

## 2017-05-09 DIAGNOSIS — R430 Anosmia: Secondary | ICD-10-CM

## 2017-05-09 DIAGNOSIS — H539 Unspecified visual disturbance: Secondary | ICD-10-CM

## 2017-05-09 DIAGNOSIS — R51 Headache: Secondary | ICD-10-CM

## 2017-05-09 DIAGNOSIS — H9193 Unspecified hearing loss, bilateral: Secondary | ICD-10-CM | POA: Diagnosis not present

## 2017-05-09 DIAGNOSIS — R519 Headache, unspecified: Secondary | ICD-10-CM

## 2017-05-09 NOTE — Patient Instructions (Addendum)
Remember to drink plenty of fluid, eat healthy meals and do not skip any meals. Try to eat protein with a every meal and eat a healthy snack such as fruit or nuts in between meals. Try to keep a regular sleep-wake schedule and try to exercise daily, particularly in the form of walking, 20-30 minutes a day, if you can.   As far as diagnostic testing: MRI brain, labs  I would like to see you back in 1 year, sooner if we need to. Please call us with any interim questions, concerns, problems, updates or refill requests.   Our phone number is [phone]. We also have an after hours call service for urgent matters and there is a physician on-call for urgent questions. For any emergencies you know to call 911 or go to the nearest emergency room

## 2017-05-09 NOTE — Progress Notes (Signed)
[facility]    Provider:  Dr Jaynee Eagles Referring Provider: Bartholome Bill, MD Primary Care Physician:  Bartholome Bill, MD  CC:  Anosmia  HPI:  Renee Kelly is a 61 y.o. female here as a referral from Dr. Luciana Axe for Anosmia. She says when she cooks she can't smell the ffod. The food has to be very strong. Has to be a bad odor. Started 4 years ago. She fell and possibly hit her head at the time. She felt dizzy and fell, the room was spinning. She hit her head and her neck, she was working night shift. Has not improved since then. Food tastes fine. Feels it is worsening slowly progressive. She complains of hearing loss. She has not been to an ENT to evaluate, she used to have "a piece of meat coming out of" my nose and she was supposed to have surgery and didn't. She has headaches. She had one last week. Headaches started 4-5 years ago after the age of 83. Her vision is worse. No diplopia. No dysphagia. She has not lost weight.No sensory loss. No focal weakness. No other focal neurologic deficits, associated symptoms, inciting events or modifiable factors. Has not had a fall in the last 2 years. Beeping pulse oin her head.  Reviewed notes, labs and imaging from outside physicians, which showed :  Personally reviewed MRI brain images which were unremarkable chronic microvascular changes within normal limits for age.  Review of Systems: Patient complains of symptoms per HPI as well as the following symptoms: headache, hearing changes, dizziness. Pertinent negatives and positives per HPI. All others negative.   Social History   Social History  . Marital status: Single    Spouse name: N/A  . Number of children: N/A  . Years of education: N/A   Occupational History  . Not on file.   Social History Main Topics  . Smoking status: Never Smoker  . Smokeless tobacco: Never Used  . Alcohol use No  . Drug use: No  . Sexual activity: Not on file   Other Topics Concern   . Not on file   Social History Narrative   Lives at home alone   Right handed    Sometimes drinks tea       Family History  Problem Relation Age of Onset  . Alzheimer's disease Mother   . Liver disease Father     Past Medical History:  Diagnosis Date  . Hypertension   . 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 No pertinent surgical history.  Current Outpatient Prescriptions  Medication Sig Dispense Refill  . Cholecalciferol (VITAMIN D3) 1000 units CAPS Take by mouth.    . metoprolol tartrate (LOPRESSOR) 25 MG tablet Take 25 mg by mouth daily.    Marland Kitchen oxybutynin (DITROPAN-XL) 5 MG 24 hr tablet Take 5 mg by mouth daily. For urinary symptoms     No current facility-administered medications for this visit.     Allergies as of 05/09/2017  . (No Known Allergies)    Vitals: BP (!) 187/94 Comment: MD aware  Pulse (!) 59   Wt 184 lb (83.5 kg)   BMI 32.59 kg/m  Last Weight:  Wt Readings from Last 1 Encounters:  05/09/17 184 lb (83.5 kg)   Last Height:   Ht Readings from Last 1 Encounters:  11/02/16 5\' 3"  (1.6 m)   Physical exam: Exam: Gen: NAD, conversant, well nourised, obese, well groomed  CV: RRR, no MRG. No Carotid Bruits. No peripheral edema, warm, nontender Eyes: Conjunctivae clear without exudates or hemorrhage  Neuro: Detailed Neurologic Exam  Speech:    Speech is normal; fluent and spontaneous with normal comprehension.  Cognition:    The patient is oriented to person, place, and time;     recent and remote memory intact;     language fluent;     normal attention, concentration,     fund of knowledge Cranial Nerves:    The pupils are equal, round, and reactive to light. Attempted fundoscopic exam could not visualize. Visual fields are full to finger confrontation. Extraocular movements are intact. Trigeminal sensation is intact and the muscles of mastication are normal. The face is symmetric. The palate elevates in the midline.  Hearing intact. Voice is normal. Shoulder shrug is normal. The tongue has normal motion without fasciculations.   Coordination:    Normal finger to nose and heel to shin.  Gait:    Heel-toe and tandem gait intact, no falls, with minimal imbalance.   Motor Observation:    No asymmetry, no atrophy, and no involuntary movements noted. Tone:    Normal muscle tone.    Posture:    Posture is slightly stooped    Strength:    Strength is V/V in the upper and lower limbs.      Sensation: intact to LT     Reflex Exam:  DTR's:    Deep tendon reflexes in the upper and lower extremities are normal bilaterally.   Toes:    The toes are downgoing bilaterally.   Clonus:    Clonus is absent.       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 Also with new onset headache after the age of 33, vision and hearing changes.  Anosmia:   - ENT referral Dr. Ernesto Rutherford - MRI brain w/wo contrast due to anosmia, new onset headache after the age of 21, hearing loss to evaluate for lesions, space-occupying masses, schwannomas. MRI of the brain in April did not have contrast. Need contrast to evaluate as well as due to progression of symptoms. - MRA head for pulsatile tinnitus to eval for aneurysm - No parkinsonism, monitor clinically - If workup negative likely due to head trauma - Consider smell therapy if workup negative, may follow up with pcp for this order, discussed with patient  Orders Placed This Encounter  Procedures  . MR BRAIN W WO CONTRAST  . MR MRA HEAD WO CONTRAST  . CBC  . Comprehensive metabolic panel  . ANA w/Reflex  . Sjogren's syndrome antibods(ssa + ssb)  . TSH  . B12 and Folate Panel  . Methylmalonic acid, serum  . Hemoglobin A1c  . Ambulatory referral to ENT     Discussed: To prevent or relieve headaches, try the following: Cool Compress. Lie down and place a cool compress on your head.    Avoid headache triggers. If certain foods or odors seem to have triggered your migraines in the past, avoid them. A headache diary might help you identify triggers.  Include physical activity in your daily routine. Try a daily walk or other moderate aerobic exercise.  Manage stress. Find healthy ways to cope with the stressors, such as delegating tasks on your to-do list.  Practice relaxation techniques. Try deep breathing, yoga, massage and visualization.  Eat regularly. Eating regularly scheduled meals and maintaining a healthy diet might  help prevent headaches. Also, drink plenty of fluids.  Follow a regular sleep schedule. Sleep deprivation might contribute to headaches Consider biofeedback. With this mind-body technique, you learn to control certain bodily functions - such as muscle tension, heart rate and blood pressure - to prevent headaches or reduce headache pain.    Proceed to emergency room if you experience new or worsening symptoms or symptoms do not resolve, if you have new neurologic symptoms or if headache is severe, or for any concerning symptom.   Provided education and documentation from American headache Society toolbox including articles on: chronic migraine medication overuse headache, chronic migraines, prevention of migraines, behavioral and other nonpharmacologic treatments for headache.   Cc: Bartholome Bill, MD  Sarina Ill, MD  [facility] [address] [place], [place] [address]  Phone [phone] Fax [phone]

## 2017-05-09 NOTE — Telephone Encounter (Signed)
Let his office know please thanks

## 2017-05-09 NOTE — Telephone Encounter (Signed)
Noted They are aware.

## 2017-05-09 NOTE — Telephone Encounter (Signed)
Dr. Ernesto Rutherford called to schedule Patient for apt. Patient declined apt.

## 2017-05-12 LAB — B12 AND FOLATE PANEL
FOLATE: 5.3 ng/mL (ref >3.0)
VITAMIN B 12: 396 pg/mL (ref 232–1245)

## 2017-05-12 LAB — COMPREHENSIVE METABOLIC PANEL
ALBUMIN: 4.3 g/dL (ref 3.6–4.8)
ALT: 11 IU/L (ref 0–32)
AST: 17 IU/L (ref 0–40)
Albumin/Globulin Ratio: 1.5 (ref 1.2–2.2)
Alkaline Phosphatase: 54 IU/L (ref 39–117)
BUN / CREAT RATIO: 18 (ref 12–28)
BUN: 16 mg/dL (ref 8–27)
Bilirubin Total: 0.6 mg/dL (ref 0.0–1.2)
CALCIUM: 9.7 mg/dL (ref 8.7–10.3)
CO2: 31 mmol/L — AB (ref 20–29)
CREATININE: 0.88 mg/dL (ref 0.57–1.00)
Chloride: 104 mmol/L (ref 96–106)
GFR calc Af Amer: 81 mL/min/{1.73_m2} (ref >59)
GFR, EST NON AFRICAN AMERICAN: 71 mL/min/{1.73_m2} (ref >59)
GLOBULIN, TOTAL: 2.8 g/dL (ref 1.5–4.5)
Glucose: 105 mg/dL — ABNORMAL HIGH (ref 65–99)
Potassium: 4.3 mmol/L (ref 3.5–5.2)
SODIUM: 143 mmol/L (ref 134–144)
Total Protein: 7.1 g/dL (ref 6.0–8.5)

## 2017-05-12 LAB — ANA W/REFLEX: Anti Nuclear Antibody(ANA): NEGATIVE

## 2017-05-12 LAB — CBC
HEMATOCRIT: 41.9 % (ref 34.0–46.6)
HEMOGLOBIN: 13.4 g/dL (ref 11.1–15.9)
MCH: 30.3 pg (ref 26.6–33.0)
MCHC: 32 g/dL (ref 31.5–35.7)
MCV: 95 fL (ref 79–97)
Platelets: 233 10*3/uL (ref 150–379)
RBC: 4.42 x10E6/uL (ref 3.77–5.28)
RDW: 13.1 % (ref 12.3–15.4)
WBC: 6.5 10*3/uL (ref 3.4–10.8)

## 2017-05-12 LAB — HEMOGLOBIN A1C
Est. average glucose Bld gHb Est-mCnc: 100 mg/dL
HEMOGLOBIN A1C: 5.1 % (ref 4.8–5.6)

## 2017-05-12 LAB — METHYLMALONIC ACID, SERUM: Methylmalonic Acid: 407 nmol/L — ABNORMAL HIGH (ref 0–378)

## 2017-05-12 LAB — SJOGREN'S SYNDROME ANTIBODS(SSA + SSB)
ENA SSA (RO) Ab: <0.2 AI (ref 0.0–0.9)
ENA SSB (LA) Ab: 0.4 AI (ref 0.0–0.9)

## 2017-05-12 LAB — TSH: TSH: 0.779 u[IU]/mL (ref 0.450–4.500)

## 2017-05-15 ENCOUNTER — Telehealth: Payer: Self-pay | Admitting: *Deleted

## 2017-05-15 NOTE — Telephone Encounter (Addendum)
Called the patient and discussed her mild Vitamin B12 deficiency and how it can be associated with Olfactory dysfunction (smell, taste). I also reviewed some of the other effects of B12 deficiency. I advised her that Dr. Jaynee Eagles is recommending that she start taking a long term B12 1000-2000 mcg daily that can be purchased over the counter and that she would need a follow up B12 level taken in 3 months.  She verbalized understanding and stated she will follow up with her PCP.     ----- Message from Melvenia Beam, MD sent at 05/12/2017 12:43 PM EDT ----- Looks like patient has mild B12 deficiency. Olfactory dysfunction(smell,taste) may be present in patients with vitamin B12 deficiency. B12 deficiency can also cause weakness, fatigue, easy bruising or bleeding,sore tongue, stomach upset, weight loss, and diarrhea or constipation, tingling or numbness to the fingers and toes, difficulty walking, mood changes, depression, memory loss, disorientation and, in severe cases, dementia. Recommend long-term 1000-2000 mcg B12 daily. recheck B12 in 3 months weeks with [place] or pcp. Thanks.

## 2017-05-26 DIAGNOSIS — H524 Presbyopia: Secondary | ICD-10-CM | POA: Diagnosis not present

## 2017-05-26 DIAGNOSIS — H2513 Age-related nuclear cataract, bilateral: Secondary | ICD-10-CM | POA: Diagnosis not present

## 2017-05-26 DIAGNOSIS — H52223 Regular astigmatism, bilateral: Secondary | ICD-10-CM | POA: Diagnosis not present

## 2017-05-26 DIAGNOSIS — H40023 Open angle with borderline findings, high risk, bilateral: Secondary | ICD-10-CM | POA: Diagnosis not present

## 2017-05-26 DIAGNOSIS — H35033 Hypertensive retinopathy, bilateral: Secondary | ICD-10-CM | POA: Diagnosis not present

## 2017-05-26 DIAGNOSIS — H04123 Dry eye syndrome of bilateral lacrimal glands: Secondary | ICD-10-CM | POA: Diagnosis not present

## 2017-06-06 ENCOUNTER — Ambulatory Visit
Admission: RE | Admit: 2017-06-06 | Discharge: 2017-06-06 | Disposition: A | Discharge status: Home / Self Care | Payer: Medicare HMO | Source: Ambulatory Visit | Attending: Neurology | Admitting: Neurology

## 2017-06-06 DIAGNOSIS — R519 Headache, unspecified: Secondary | ICD-10-CM

## 2017-06-06 DIAGNOSIS — R430 Anosmia: Secondary | ICD-10-CM

## 2017-06-06 DIAGNOSIS — H93A9 Pulsatile tinnitus, unspecified ear: Secondary | ICD-10-CM

## 2017-06-06 DIAGNOSIS — R51 Headache: Secondary | ICD-10-CM

## 2017-06-06 DIAGNOSIS — R29818 Other symptoms and signs involving the nervous system: Secondary | ICD-10-CM

## 2017-06-06 DIAGNOSIS — H9193 Unspecified hearing loss, bilateral: Secondary | ICD-10-CM

## 2017-06-06 DIAGNOSIS — R42 Dizziness and giddiness: Secondary | ICD-10-CM | POA: Diagnosis not present

## 2017-06-06 DIAGNOSIS — H539 Unspecified visual disturbance: Secondary | ICD-10-CM

## 2017-06-06 MED ORDER — GADOBENATE DIMEGLUMINE 529 MG/ML IV SOLN
17.0000 mL | Freq: Once | INTRAVENOUS | Status: AC | PRN
  Administered 2017-06-06: 17 mL via INTRAVENOUS

## 2017-06-08 ENCOUNTER — Telehealth: Payer: Self-pay | Admitting: *Deleted

## 2017-06-08 NOTE — Telephone Encounter (Signed)
Error, duplicate encounter

## 2017-06-08 NOTE — Telephone Encounter (Signed)
-----   Message from Melvenia Beam, MD sent at 06/08/2017 11:16 AM EST ----- MRI of the brain normal for age thanks

## 2017-06-08 NOTE — Telephone Encounter (Signed)
Called patient. She verbalized understanding that her MRI of her brain is normal for her age and her MRA of her head is normal. She inquired what about her nose. I informed her nothing concerning on MRI to explain anosmia. I advised her to f/u with referral to ENT Dr. Ernesto Rutherford. She has not seen him yet. She verbalized understanding and had no further questions.

## 2017-06-16 DIAGNOSIS — J343 Hypertrophy of nasal turbinates: Secondary | ICD-10-CM | POA: Diagnosis not present

## 2017-06-16 DIAGNOSIS — H903 Sensorineural hearing loss, bilateral: Secondary | ICD-10-CM | POA: Diagnosis not present

## 2017-06-16 DIAGNOSIS — R430 Anosmia: Secondary | ICD-10-CM | POA: Diagnosis not present

## 2017-06-16 DIAGNOSIS — J342 Deviated nasal septum: Secondary | ICD-10-CM | POA: Diagnosis not present

## 2017-06-16 DIAGNOSIS — H8143 Vertigo of central origin, bilateral: Secondary | ICD-10-CM | POA: Diagnosis not present

## 2017-07-11 IMAGING — MR MR HEAD W/O CM
9 of 10 series · 36 of 48 positions shown · non-contrast
Comparison: None.

CLINICAL DATA: Left facial numbness and left arm numbness. Near
syncope.

EXAM:
MRI HEAD WITHOUT CONTRAST
TECHNIQUE: Multiplanar, multiecho pulse sequences of the brain and surrounding
structures were obtained without intravenous contrast.

[Series 3: DWI · axial · 3.0mm · 0.94mm/px · z∈[-47,+78]mm · 9 of 88 slices shown (1 of 2)]
[im 1/88]
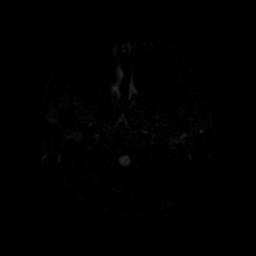
[im 11/88]
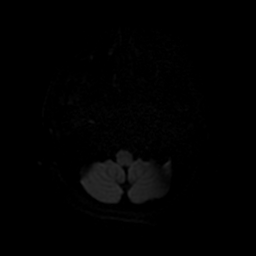
[im 22/88]
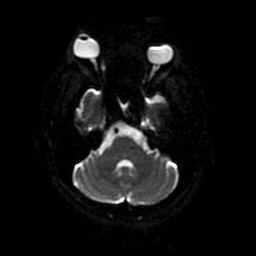
[im 33/88]
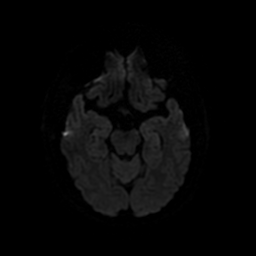
[im 44/88]
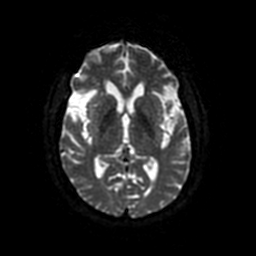
[im 55/88]
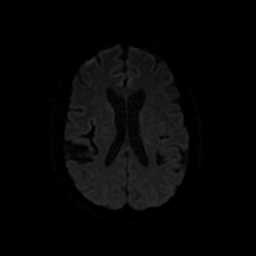
[im 66/88]
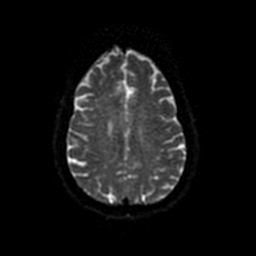
[im 77/88]
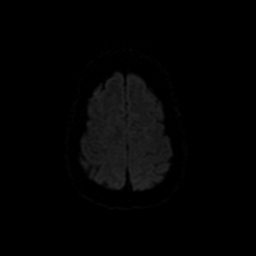
[im 88/88]
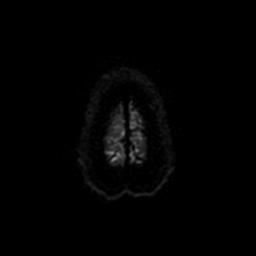

[Series 5: T2 · axial · 5.0mm · 0.47mm/px · z∈[-46,+77]mm · 2 of 22 slices shown (1 of 2)]
[im 1/22]
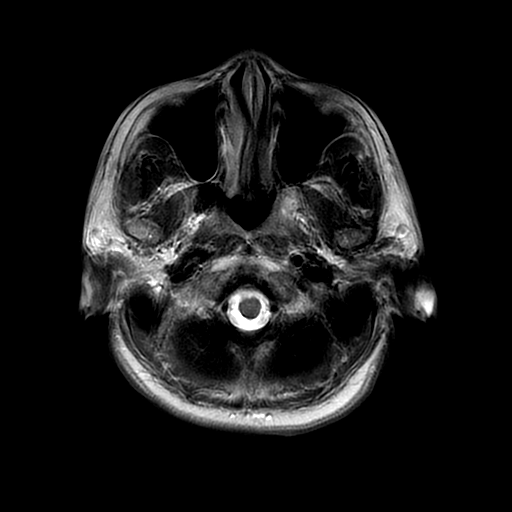
[im 22/22]
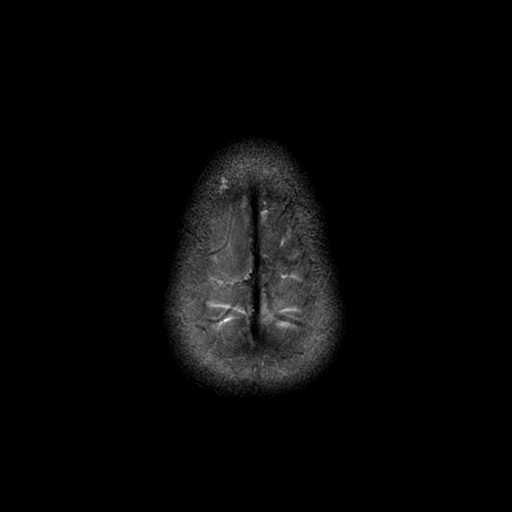

[Series 6: DWI · coronal · 4.0mm · 0.94mm/px · 7 of 70 slices shown (2 of 2)]
[im 1/70]
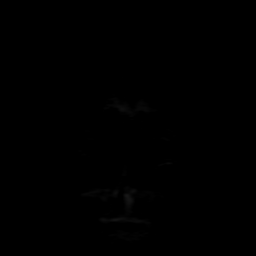
[im 12/70]
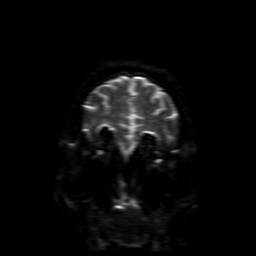
[im 24/70]
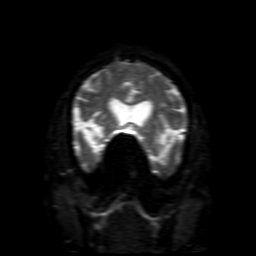
[im 35/70]
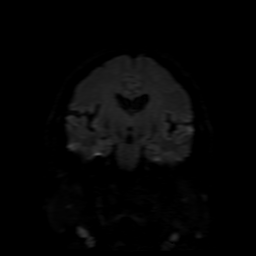
[im 47/70]
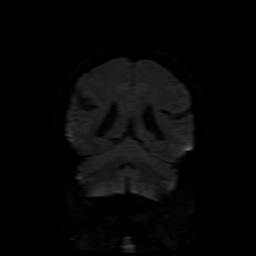
[im 58/70]
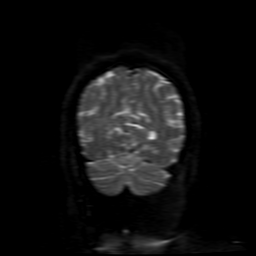
[im 70/70]
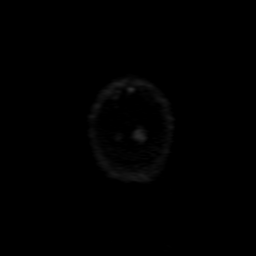

[Series 7: FLAIR · axial · 5.0mm · 0.47mm/px · z∈[-46,+77]mm · 2 of 22 slices shown (1 of 2)]
[im 1/22]
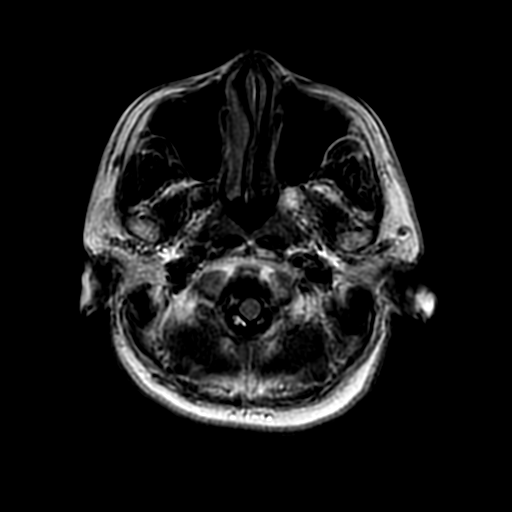
[im 22/22]
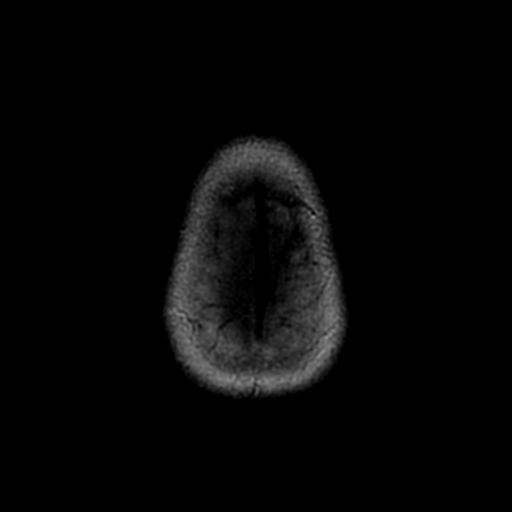

[Series 8: (person_name) · axial · 3.0mm · 0.47mm/px · z∈[-47,-32]mm · 2 of 88 slices shown]
[im 1/88]
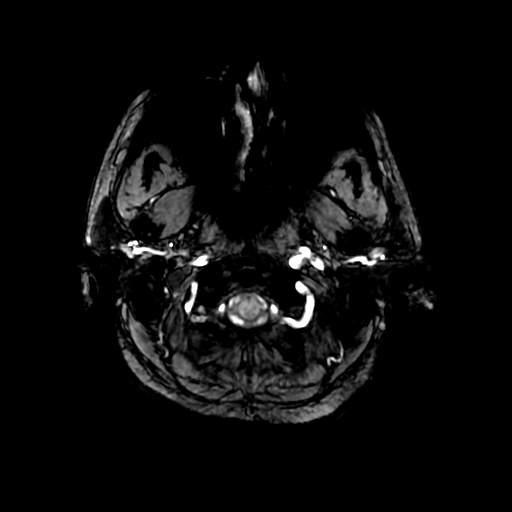
[im 11/88]
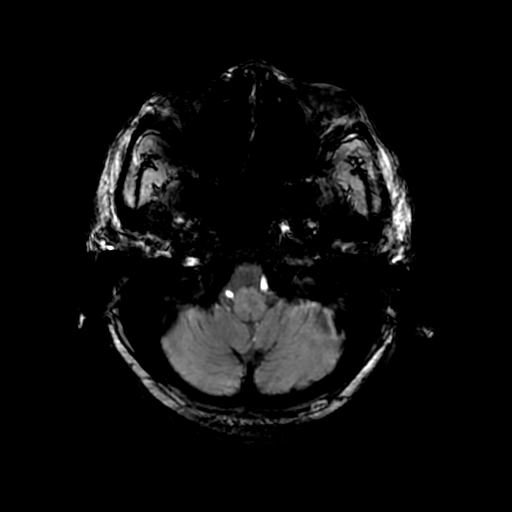

[Series 9: FLAIR · sagittal · 5.0mm · 0.47mm/px · 2 of 21 slices shown (2 of 2)]
[im 1/21]
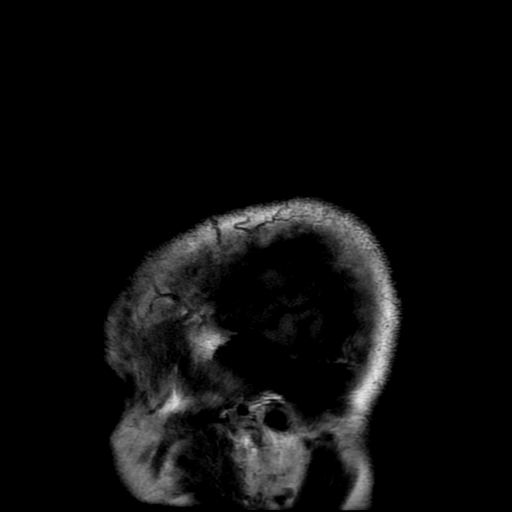
[im 21/21]
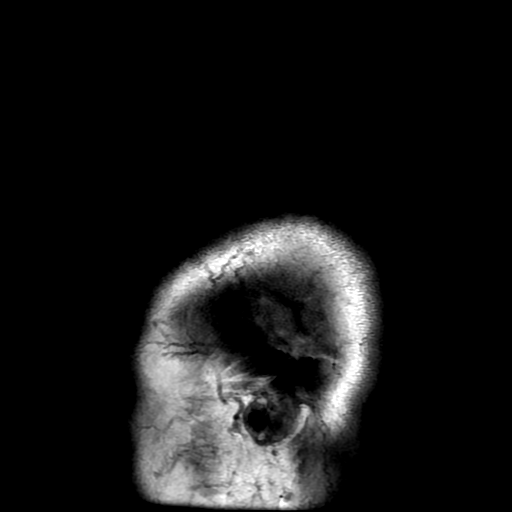

[Series 11: T2 · coronal · 5.0mm · 0.39mm/px · 3 of 29 slices shown (2 of 2)]
[im 1/29]
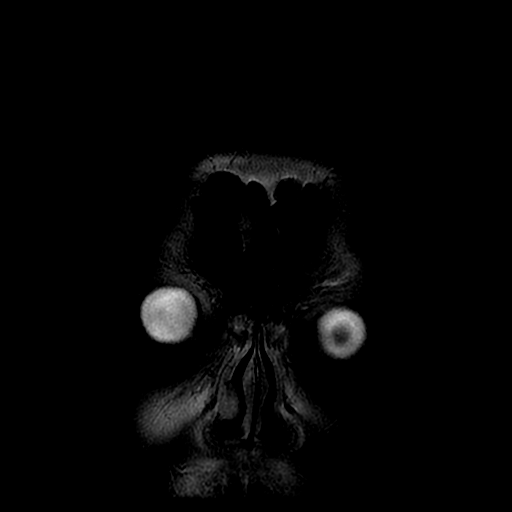
[im 15/29]
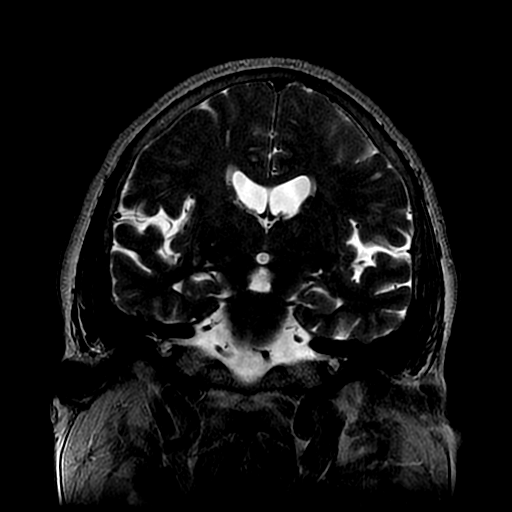
[im 29/29]
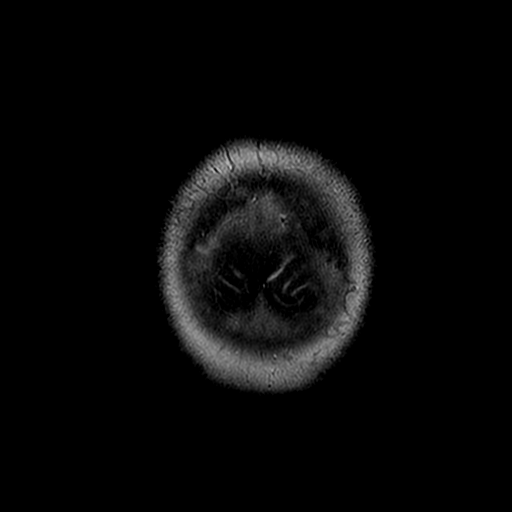

[Series 350: ADC · axial · 3.0mm · 0.94mm/px · z∈[-47,+78]mm · 5 of 44 slices shown (1 of 2)]
[im 1/44]
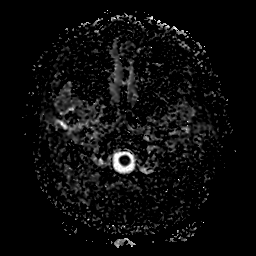
[im 11/44]
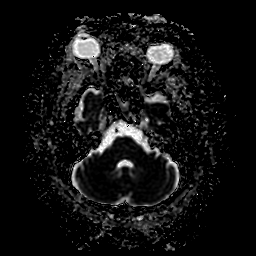
[im 22/44]
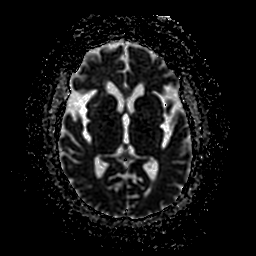
[im 33/44]
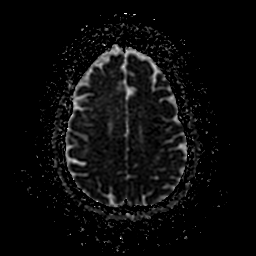
[im 44/44]
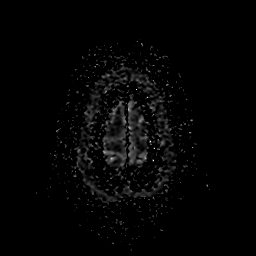

[Series 650: ADC · coronal · 4.0mm · 0.94mm/px · 4 of 35 slices shown (2 of 2)]
[im 1/35]
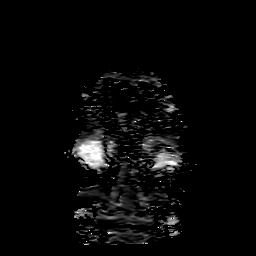
[im 12/35]
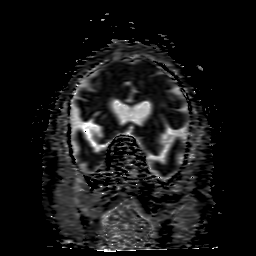
[im 23/35]
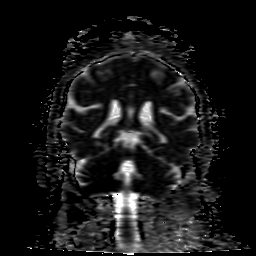
[im 35/35]
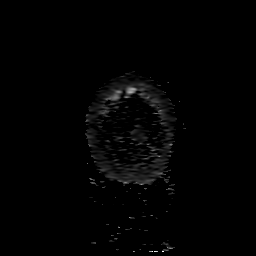

[36 of 48 positions shown; findings below may reference images not displayed]

FINDINGS: Brain: The midline structures are normal. No focal diffusion
restriction to indicate acute infarct. No intraparenchymal
hemorrhage. Cystic foci within both medial temporal lobes are likely
benign neuroglial cysts. There is multifocal hyperintense
T2-weighted signal within the periventricular and subcortical white
matter, most often seen in the setting of chronic microvascular
ischemia. No mass lesion. No chronic microhemorrhage or cerebral
amyloid angiopathy. No hydrocephalus, age advanced atrophy or lobar
predominant volume loss. No dural abnormality or extra-axial
collection.

Vascular: Major intracranial arterial and venous sinus flow voids
are preserved.

Skull and upper cervical spine: The visualized skull base,
calvarium, upper cervical spine and extracranial soft tissues are
normal.

Sinuses/Orbits: No fluid levels or advanced mucosal thickening. No
mastoid effusion. Normal orbits.
IMPRESSION: Chronic microvascular ischemia without acute intracranial
abnormality.

## 2017-08-03 DIAGNOSIS — M67912 Unspecified disorder of synovium and tendon, left shoulder: Secondary | ICD-10-CM | POA: Diagnosis not present

## 2017-08-03 DIAGNOSIS — M67911 Unspecified disorder of synovium and tendon, right shoulder: Secondary | ICD-10-CM | POA: Diagnosis not present

## 2017-08-03 DIAGNOSIS — I10 Essential (primary) hypertension: Secondary | ICD-10-CM | POA: Diagnosis not present

## 2017-08-22 DIAGNOSIS — M25511 Pain in right shoulder: Secondary | ICD-10-CM | POA: Diagnosis not present

## 2017-08-22 DIAGNOSIS — G8929 Other chronic pain: Secondary | ICD-10-CM | POA: Diagnosis not present

## 2017-08-22 DIAGNOSIS — M25612 Stiffness of left shoulder, not elsewhere classified: Secondary | ICD-10-CM | POA: Diagnosis not present

## 2017-08-22 DIAGNOSIS — M25512 Pain in left shoulder: Secondary | ICD-10-CM | POA: Diagnosis not present

## 2017-08-22 DIAGNOSIS — M25611 Stiffness of right shoulder, not elsewhere classified: Secondary | ICD-10-CM | POA: Diagnosis not present

## 2017-08-22 DIAGNOSIS — M67911 Unspecified disorder of synovium and tendon, right shoulder: Secondary | ICD-10-CM | POA: Diagnosis not present

## 2017-08-22 DIAGNOSIS — M67912 Unspecified disorder of synovium and tendon, left shoulder: Secondary | ICD-10-CM | POA: Diagnosis not present

## 2017-08-22 DIAGNOSIS — M6281 Muscle weakness (generalized): Secondary | ICD-10-CM | POA: Diagnosis not present

## 2017-08-25 DIAGNOSIS — H35033 Hypertensive retinopathy, bilateral: Secondary | ICD-10-CM | POA: Diagnosis not present

## 2017-08-25 DIAGNOSIS — H04123 Dry eye syndrome of bilateral lacrimal glands: Secondary | ICD-10-CM | POA: Diagnosis not present

## 2017-08-25 DIAGNOSIS — H401122 Primary open-angle glaucoma, left eye, moderate stage: Secondary | ICD-10-CM | POA: Diagnosis not present

## 2017-08-25 DIAGNOSIS — H401111 Primary open-angle glaucoma, right eye, mild stage: Secondary | ICD-10-CM | POA: Diagnosis not present

## 2017-08-25 DIAGNOSIS — H2513 Age-related nuclear cataract, bilateral: Secondary | ICD-10-CM | POA: Diagnosis not present

## 2017-08-30 DIAGNOSIS — M792 Neuralgia and neuritis, unspecified: Secondary | ICD-10-CM | POA: Diagnosis not present

## 2017-08-30 DIAGNOSIS — H409 Unspecified glaucoma: Secondary | ICD-10-CM | POA: Diagnosis not present

## 2017-08-30 DIAGNOSIS — I499 Cardiac arrhythmia, unspecified: Secondary | ICD-10-CM | POA: Diagnosis not present

## 2017-08-30 DIAGNOSIS — G4730 Sleep apnea, unspecified: Secondary | ICD-10-CM | POA: Diagnosis not present

## 2017-08-30 DIAGNOSIS — I10 Essential (primary) hypertension: Secondary | ICD-10-CM | POA: Diagnosis not present

## 2017-08-30 DIAGNOSIS — R32 Unspecified urinary incontinence: Secondary | ICD-10-CM | POA: Diagnosis not present

## 2017-08-30 DIAGNOSIS — G8929 Other chronic pain: Secondary | ICD-10-CM | POA: Diagnosis not present

## 2017-08-30 DIAGNOSIS — K0889 Other specified disorders of teeth and supporting structures: Secondary | ICD-10-CM | POA: Diagnosis not present

## 2017-08-30 DIAGNOSIS — E669 Obesity, unspecified: Secondary | ICD-10-CM | POA: Diagnosis not present

## 2017-08-30 DIAGNOSIS — H547 Unspecified visual loss: Secondary | ICD-10-CM | POA: Diagnosis not present

## 2017-12-12 DIAGNOSIS — I10 Essential (primary) hypertension: Secondary | ICD-10-CM | POA: Diagnosis not present

## 2017-12-12 DIAGNOSIS — R011 Cardiac murmur, unspecified: Secondary | ICD-10-CM | POA: Diagnosis not present

## 2017-12-20 DIAGNOSIS — H401111 Primary open-angle glaucoma, right eye, mild stage: Secondary | ICD-10-CM | POA: Diagnosis not present

## 2017-12-20 DIAGNOSIS — H04123 Dry eye syndrome of bilateral lacrimal glands: Secondary | ICD-10-CM | POA: Diagnosis not present

## 2017-12-20 DIAGNOSIS — H401122 Primary open-angle glaucoma, left eye, moderate stage: Secondary | ICD-10-CM | POA: Diagnosis not present

## 2017-12-20 DIAGNOSIS — H2513 Age-related nuclear cataract, bilateral: Secondary | ICD-10-CM | POA: Diagnosis not present

## 2018-01-01 DIAGNOSIS — R0989 Other specified symptoms and signs involving the circulatory and respiratory systems: Secondary | ICD-10-CM | POA: Diagnosis not present

## 2018-01-01 DIAGNOSIS — I358 Other nonrheumatic aortic valve disorders: Secondary | ICD-10-CM | POA: Diagnosis not present

## 2018-01-01 DIAGNOSIS — I10 Essential (primary) hypertension: Secondary | ICD-10-CM | POA: Diagnosis not present

## 2018-01-01 DIAGNOSIS — R9431 Abnormal electrocardiogram [ECG] [EKG]: Secondary | ICD-10-CM | POA: Diagnosis not present

## 2018-02-07 DIAGNOSIS — R0989 Other specified symptoms and signs involving the circulatory and respiratory systems: Secondary | ICD-10-CM | POA: Diagnosis not present

## 2018-02-07 DIAGNOSIS — I358 Other nonrheumatic aortic valve disorders: Secondary | ICD-10-CM | POA: Diagnosis not present

## 2018-02-16 DIAGNOSIS — R9431 Abnormal electrocardiogram [ECG] [EKG]: Secondary | ICD-10-CM | POA: Diagnosis not present

## 2018-02-16 DIAGNOSIS — I10 Essential (primary) hypertension: Secondary | ICD-10-CM | POA: Diagnosis not present

## 2018-02-16 DIAGNOSIS — Q248 Other specified congenital malformations of heart: Secondary | ICD-10-CM | POA: Diagnosis not present

## 2018-02-16 DIAGNOSIS — I517 Cardiomegaly: Secondary | ICD-10-CM | POA: Diagnosis not present

## 2018-03-02 DIAGNOSIS — R0609 Other forms of dyspnea: Secondary | ICD-10-CM | POA: Diagnosis not present

## 2018-03-02 DIAGNOSIS — I517 Cardiomegaly: Secondary | ICD-10-CM | POA: Diagnosis not present

## 2018-03-02 DIAGNOSIS — R9431 Abnormal electrocardiogram [ECG] [EKG]: Secondary | ICD-10-CM | POA: Diagnosis not present

## 2018-03-02 DIAGNOSIS — I10 Essential (primary) hypertension: Secondary | ICD-10-CM | POA: Diagnosis not present

## 2018-03-27 DIAGNOSIS — Z0000 Encounter for general adult medical examination without abnormal findings: Secondary | ICD-10-CM | POA: Diagnosis not present

## 2018-03-27 DIAGNOSIS — R001 Bradycardia, unspecified: Secondary | ICD-10-CM | POA: Diagnosis not present

## 2018-03-27 DIAGNOSIS — G8929 Other chronic pain: Secondary | ICD-10-CM | POA: Diagnosis not present

## 2018-03-27 DIAGNOSIS — R002 Palpitations: Secondary | ICD-10-CM | POA: Diagnosis not present

## 2018-03-27 DIAGNOSIS — R9431 Abnormal electrocardiogram [ECG] [EKG]: Secondary | ICD-10-CM | POA: Diagnosis not present

## 2018-03-27 DIAGNOSIS — M25512 Pain in left shoulder: Secondary | ICD-10-CM | POA: Diagnosis not present

## 2018-03-27 DIAGNOSIS — Z13220 Encounter for screening for lipoid disorders: Secondary | ICD-10-CM | POA: Diagnosis not present

## 2018-03-27 DIAGNOSIS — R7309 Other abnormal glucose: Secondary | ICD-10-CM | POA: Diagnosis not present

## 2018-04-23 DIAGNOSIS — H401122 Primary open-angle glaucoma, left eye, moderate stage: Secondary | ICD-10-CM | POA: Diagnosis not present

## 2018-04-23 DIAGNOSIS — H2513 Age-related nuclear cataract, bilateral: Secondary | ICD-10-CM | POA: Diagnosis not present

## 2018-04-23 DIAGNOSIS — H401111 Primary open-angle glaucoma, right eye, mild stage: Secondary | ICD-10-CM | POA: Diagnosis not present

## 2018-04-23 DIAGNOSIS — H04123 Dry eye syndrome of bilateral lacrimal glands: Secondary | ICD-10-CM | POA: Diagnosis not present

## 2018-05-10 DIAGNOSIS — R69 Illness, unspecified: Secondary | ICD-10-CM | POA: Diagnosis not present

## 2018-05-11 ENCOUNTER — Ambulatory Visit: Payer: Medicare HMO | Admitting: Nurse Practitioner

## 2018-05-16 DIAGNOSIS — Q248 Other specified congenital malformations of heart: Secondary | ICD-10-CM | POA: Diagnosis not present

## 2018-05-16 DIAGNOSIS — I517 Cardiomegaly: Secondary | ICD-10-CM | POA: Diagnosis not present

## 2018-05-16 DIAGNOSIS — R9431 Abnormal electrocardiogram [ECG] [EKG]: Secondary | ICD-10-CM | POA: Diagnosis not present

## 2018-05-16 DIAGNOSIS — I10 Essential (primary) hypertension: Secondary | ICD-10-CM | POA: Diagnosis not present

## 2018-05-24 DIAGNOSIS — G8929 Other chronic pain: Secondary | ICD-10-CM | POA: Diagnosis not present

## 2018-05-24 DIAGNOSIS — M19012 Primary osteoarthritis, left shoulder: Secondary | ICD-10-CM | POA: Diagnosis not present

## 2018-05-24 DIAGNOSIS — M25512 Pain in left shoulder: Secondary | ICD-10-CM | POA: Diagnosis not present

## 2018-08-27 DIAGNOSIS — H2513 Age-related nuclear cataract, bilateral: Secondary | ICD-10-CM | POA: Diagnosis not present

## 2018-08-27 DIAGNOSIS — H401122 Primary open-angle glaucoma, left eye, moderate stage: Secondary | ICD-10-CM | POA: Diagnosis not present

## 2018-08-27 DIAGNOSIS — H04123 Dry eye syndrome of bilateral lacrimal glands: Secondary | ICD-10-CM | POA: Diagnosis not present

## 2018-08-27 DIAGNOSIS — H401111 Primary open-angle glaucoma, right eye, mild stage: Secondary | ICD-10-CM | POA: Diagnosis not present

## 2018-09-03 DIAGNOSIS — I10 Essential (primary) hypertension: Secondary | ICD-10-CM | POA: Diagnosis not present

## 2018-09-03 DIAGNOSIS — Z809 Family history of malignant neoplasm, unspecified: Secondary | ICD-10-CM | POA: Diagnosis not present

## 2018-09-03 DIAGNOSIS — H409 Unspecified glaucoma: Secondary | ICD-10-CM | POA: Diagnosis not present

## 2018-09-03 DIAGNOSIS — E669 Obesity, unspecified: Secondary | ICD-10-CM | POA: Diagnosis not present

## 2018-09-03 DIAGNOSIS — M1990 Unspecified osteoarthritis, unspecified site: Secondary | ICD-10-CM | POA: Diagnosis not present

## 2018-09-03 DIAGNOSIS — G4730 Sleep apnea, unspecified: Secondary | ICD-10-CM | POA: Diagnosis not present

## 2018-09-03 DIAGNOSIS — R32 Unspecified urinary incontinence: Secondary | ICD-10-CM | POA: Diagnosis not present

## 2018-09-03 DIAGNOSIS — R69 Illness, unspecified: Secondary | ICD-10-CM | POA: Diagnosis not present

## 2018-09-03 DIAGNOSIS — Z6833 Body mass index (BMI) 33.0-33.9, adult: Secondary | ICD-10-CM | POA: Diagnosis not present

## 2018-09-03 DIAGNOSIS — H04129 Dry eye syndrome of unspecified lacrimal gland: Secondary | ICD-10-CM | POA: Diagnosis not present

## 2018-12-17 DIAGNOSIS — I10 Essential (primary) hypertension: Secondary | ICD-10-CM | POA: Diagnosis not present

## 2018-12-17 DIAGNOSIS — N3941 Urge incontinence: Secondary | ICD-10-CM | POA: Diagnosis not present

## 2018-12-17 DIAGNOSIS — Z1211 Encounter for screening for malignant neoplasm of colon: Secondary | ICD-10-CM | POA: Diagnosis not present

## 2019-01-24 DIAGNOSIS — I10 Essential (primary) hypertension: Secondary | ICD-10-CM | POA: Insufficient documentation

## 2019-01-24 DIAGNOSIS — C569 Malignant neoplasm of unspecified ovary: Secondary | ICD-10-CM | POA: Insufficient documentation

## 2019-02-01 ENCOUNTER — Ambulatory Visit: Payer: Medicare HMO | Admitting: *Deleted

## 2019-02-01 ENCOUNTER — Other Ambulatory Visit: Payer: Self-pay

## 2019-02-01 VITALS — Ht 63.0 in | Wt 158.0 lb

## 2019-02-01 DIAGNOSIS — Z1211 Encounter for screening for malignant neoplasm of colon: Secondary | ICD-10-CM

## 2019-02-01 MED ORDER — SUPREP BOWEL PREP KIT 17.5-3.13-1.6 GM/177ML PO SOLN: 1.0000 | Freq: Once | ORAL | 0 refills | Status: AC

## 2019-02-01 NOTE — Progress Notes (Signed)
No egg or soy allergy known to patient  No issues with past sedation with any surgeries  or procedures, no past  intubation  No diet pills per patient No home 02 use per patient  No blood thinners per patient  Pt denies issues with constipation now- some past hx of constipation - pt states has a BM daily that is soft and regular  No A fib or A flutter  EMMI video sent to pt's e mail   Pt states one month ago she had 1 episode of rectal bleeding but no more- small amount- she states she has had 2 previous colonoscopies- 1st she had 3 polyps, 2nd no polyps told recall in 10 years   Pt verified name, DOB, address and insurance during PV today. Pt mailed instruction packet to included paper to complete and mail back to [facility] with addressed and stamped envelope, Emmi video, copy of consent form to read and not return, and instructions. PV completed over the phone. Pt encouraged to call with questions or issues   Pt is aware that care partner will wait in the car during procedure; if they feel like they will be too hot to wait in the car; they may wait in the lobby.  We want them to wear a mask (we do not have any that we can provide them), practice social distancing, and we will check their temperatures when they get here.  I did remind patient that their care partner needs to stay in the parking lot the entire time. Pt will wear mask into building.

## 2019-02-12 ENCOUNTER — Other Ambulatory Visit: Payer: Self-pay | Admitting: Cardiology

## 2019-02-13 ENCOUNTER — Other Ambulatory Visit: Payer: Self-pay | Admitting: Cardiology

## 2019-02-13 NOTE — Telephone Encounter (Signed)
This was denied

## 2019-02-14 ENCOUNTER — Telehealth: Payer: Self-pay | Admitting: Gastroenterology

## 2019-02-14 NOTE — Telephone Encounter (Signed)
She has an appoitnemnt to se me in Nov. You should refill

## 2019-02-14 NOTE — Telephone Encounter (Signed)

## 2019-02-15 ENCOUNTER — Other Ambulatory Visit: Payer: Self-pay

## 2019-02-15 ENCOUNTER — Ambulatory Visit (AMBULATORY_SURGERY_CENTER): Payer: Medicare HMO | Admitting: Gastroenterology

## 2019-02-15 ENCOUNTER — Encounter: Payer: Self-pay | Admitting: Gastroenterology

## 2019-02-15 VITALS — BP 136/90 | HR 68 | Temp 98.6°F | Resp 17 | Ht 63.0 in | Wt 158.0 lb

## 2019-02-15 DIAGNOSIS — Z1211 Encounter for screening for malignant neoplasm of colon: Secondary | ICD-10-CM | POA: Diagnosis not present

## 2019-02-15 DIAGNOSIS — D120 Benign neoplasm of cecum: Secondary | ICD-10-CM

## 2019-02-15 DIAGNOSIS — Z86010 Personal history of colonic polyps: Secondary | ICD-10-CM | POA: Diagnosis not present

## 2019-02-15 MED ORDER — SODIUM CHLORIDE 0.9 % IV SOLN: 500.0000 mL | INTRAVENOUS | Status: DC

## 2019-02-15 NOTE — Progress Notes (Signed)
Covid screening and temp done by June

## 2019-02-15 NOTE — Op Note (Signed)
[place] Patient Name: Renee Kelly Procedure Date: 02/15/2019 9:44 AM MRN: [id number] Endoscopist: Thornton Park MD, MD Age: 63 Referring MD:  Date of Birth: [birth year] Gender: Female Account #: [id number] Procedure:                Colonoscopy Indications:              Surveillance: Personal history of colonic polyps                            (unknown histology) on last colonoscopy 5 years ago                           Two prior colonoscopies in [place]: polyps identified on                            the first exam, second exam was normal                           No known family history of colon cancer or polyps                           No baseline GI symptoms Medicines:                See the Anesthesia note for documentation of the                            administered medications Procedure:                Pre-Anesthesia Assessment:                           - Prior to the procedure, a History and Physical                            was performed, and patient medications and                            allergies were reviewed. The patient's tolerance of                            previous anesthesia was also reviewed. The risks                            and benefits of the procedure and the sedation                            options and risks were discussed with the patient.                            All questions were answered, and informed consent                            was obtained. Prior Anticoagulants: The patient has  taken no previous anticoagulant or antiplatelet                            agents. ASA Grade Assessment: II - A patient with                            mild systemic disease. After reviewing the risks                            and benefits, the patient was deemed in                            satisfactory condition to undergo the procedure.                           After obtaining informed consent, the  colonoscope                            was passed under direct vision. Throughout the                            procedure, the patient's blood pressure, pulse, and                            oxygen saturations were monitored continuously. The                            Colonoscope was introduced through the anus and                            advanced to the the terminal ileum, with                            identification of the appendiceal orifice and IC                            valve. A second forward view of the right colon was                            normal. The colonoscopy was performed without                            difficulty. The patient tolerated the procedure                            well. The quality of the bowel preparation was                            excellent. The terminal ileum, ileocecal valve,                            appendiceal orifice, and rectum were photographed. Scope In: 6:96:29 AM Scope Out: 10:09:41 AM Scope Withdrawal Time: 0 hours 10 minutes 58 seconds  Total Procedure Duration: 0 hours 12 minutes 54 seconds  Findings:                 The perianal and digital rectal examinations were                            normal.                           A 3 mm polyp was found in the cecum. The polyp was                            sessile. The polyp was removed with a cold snare.                            Resection and retrieval were complete. Estimated                            blood loss was minimal.                           Retroflex views of the rectum revealed a linear                            mucosal abnormality of unclear etiology that                            communicates with the dentate line. The proximal                            edge appears benign. The exam was otherwise without                            abnormality on direct and retroflexion views. Complications:            No immediate complications. Estimated blood loss:                             Minimal. Estimated Blood Loss:     Estimated blood loss was minimal. Impression:               - One 3 mm polyp in the cecum, removed with a cold                            snare. Resected and retrieved.                           - Linear rectal mucosal abnormality of unclear                            significance.                           - The examination was otherwise normal on direct  and retroflexion views. Recommendation:           - Patient has a contact number available for                            emergencies. The signs and symptoms of potential                            delayed complications were discussed with the                            patient. Return to normal activities tomorrow.                            Written discharge instructions were provided to the                            patient.                           - Resume previous diet today.                           - Continue present medications.                           - Await pathology results.                           - Repeat colonoscopy in 7 years for surveillance.                           - Refer to Dr. Nadeen Landau to evaluate the                            rectal abnormality. Thornton Park MD, MD 02/15/2019 10:18:30 AM This report has been signed electronically.

## 2019-02-15 NOTE — Progress Notes (Signed)
Pt's states no medical or surgical changes since previsit or office visit. 

## 2019-02-15 NOTE — Progress Notes (Signed)
Called to room to assist during endoscopic procedure.  Patient ID and intended procedure confirmed with present staff. Received instructions for my participation in the procedure from the performing physician.  

## 2019-02-15 NOTE — Patient Instructions (Signed)
YOU HAD AN ENDOSCOPIC PROCEDURE TODAY AT THE [facility]:   Refer to the procedure report that was given to you for any specific questions about what was found during the examination.  If the procedure report does not answer your questions, please call your gastroenterologist to clarify.  If you requested that your care partner not be given the details of your procedure findings, then the procedure report has been included in a sealed envelope for you to review at your convenience later.  YOU SHOULD EXPECT: Some feelings of bloating in the abdomen. Passage of more gas than usual.  Walking can help get rid of the air that was put into your GI tract during the procedure and reduce the bloating. If you had a lower endoscopy (such as a colonoscopy or flexible sigmoidoscopy) you may notice spotting of blood in your stool or on the toilet paper. If you underwent a bowel prep for your procedure, you may not have a normal bowel movement for a few days.  Please Note:  You might notice some irritation and congestion in your nose or some drainage.  This is from the oxygen used during your procedure.  There is no need for concern and it should clear up in a day or so.  SYMPTOMS TO REPORT IMMEDIATELY:   Following lower endoscopy (colonoscopy or flexible sigmoidoscopy):  Excessive amounts of blood in the stool  Significant tenderness or worsening of abdominal pains  Swelling of the abdomen that is new, acute  Fever of 100F or higher  For urgent or emergent issues, a gastroenterologist can be reached at any hour by calling [phone].   DIET:  We do recommend a small meal at first, but then you may proceed to your regular diet.  Drink plenty of fluids but you should avoid alcoholic beverages for 24 hours.  ACTIVITY:  You should plan to take it easy for the rest of today and you should NOT DRIVE or use heavy machinery until tomorrow (because of the sedation medicines used during the test).     FOLLOW UP: Our staff will call the number listed on your records 48-72 hours following your procedure to check on you and address any questions or concerns that you may have regarding the information given to you following your procedure. If we do not reach you, we will leave a message.  We will attempt to reach you two times.  During this call, we will ask if you have developed any symptoms of COVID 19. If you develop any symptoms (ie: fever, flu-like symptoms, shortness of breath, cough etc.) before then, please call [phone].  If you test positive for Covid 19 in the 2 weeks post procedure, please call and report this information to us.    If any biopsies were taken you will be contacted by phone or by letter within the next 1-3 weeks.  Please call us at [phone] if you have not heard about the biopsies in 3 weeks.    SIGNATURES/CONFIDENTIALITY: You and/or your care partner have signed paperwork which will be entered into your electronic medical record.  These signatures attest to the fact that that the information above on your After Visit Summary has been reviewed and is understood.  Full responsibility of the confidentiality of this discharge information lies with you and/or your care-partner. 

## 2019-02-15 NOTE — Progress Notes (Signed)
To PACU, VSS. Report to [email or web address] 

## 2019-02-18 ENCOUNTER — Telehealth: Payer: Self-pay | Admitting: *Deleted

## 2019-02-18 NOTE — Telephone Encounter (Signed)
Per Dr. Tarri Glenn procedure reports from 02/15/2019, surgical referral faxed to [facility] for rectal mucosal abnormality.

## 2019-02-19 ENCOUNTER — Telehealth: Payer: Self-pay

## 2019-02-19 NOTE — Telephone Encounter (Signed)
  Follow up Call-  Call back number 02/15/2019  Post procedure Call Back phone  # [phone]  Some recent data might be hidden     Patient questions:  Do you have a fever, pain , or abdominal swelling? No. Pain Score  0 *  Have you tolerated food without any problems? Yes.    Have you been able to return to your normal activities? Yes.    Do you have any questions about your discharge instructions: Diet   No. Medications  No. Follow up visit  No.  Do you have questions or concerns about your Care? No.  Actions: * If pain score is 4 or above: 1. No action needed, pain <4.Have you developed a fever since your procedure? no  2.   Have you had an respiratory symptoms (SOB or cough) since your procedure? no  3.   Have you tested positive for COVID 19 since your procedure no  4.   Have you had any family members/close contacts diagnosed with the COVID 19 since your procedure?  no   If yes to any of these questions please route to Joylene John, RN and Alphonsa Gin, Therapist, sports.

## 2019-02-20 ENCOUNTER — Encounter: Payer: Self-pay | Admitting: Gastroenterology

## 2019-03-21 ENCOUNTER — Telehealth: Payer: Self-pay | Admitting: Gastroenterology

## 2019-03-21 NOTE — Telephone Encounter (Signed)
Spoke to patient who said she was unaware of the need to be referred to [facility] and did not want to be referred or seen.

## 2019-03-25 DIAGNOSIS — Z13220 Encounter for screening for lipoid disorders: Secondary | ICD-10-CM | POA: Diagnosis not present

## 2019-03-25 DIAGNOSIS — H2513 Age-related nuclear cataract, bilateral: Secondary | ICD-10-CM | POA: Diagnosis not present

## 2019-03-25 DIAGNOSIS — I10 Essential (primary) hypertension: Secondary | ICD-10-CM | POA: Diagnosis not present

## 2019-03-25 DIAGNOSIS — I421 Obstructive hypertrophic cardiomyopathy: Secondary | ICD-10-CM | POA: Diagnosis not present

## 2019-03-25 DIAGNOSIS — H401122 Primary open-angle glaucoma, left eye, moderate stage: Secondary | ICD-10-CM | POA: Diagnosis not present

## 2019-03-25 DIAGNOSIS — R42 Dizziness and giddiness: Secondary | ICD-10-CM | POA: Diagnosis not present

## 2019-03-25 DIAGNOSIS — H04123 Dry eye syndrome of bilateral lacrimal glands: Secondary | ICD-10-CM | POA: Diagnosis not present

## 2019-03-25 DIAGNOSIS — H401111 Primary open-angle glaucoma, right eye, mild stage: Secondary | ICD-10-CM | POA: Diagnosis not present

## 2019-03-29 DIAGNOSIS — Z0000 Encounter for general adult medical examination without abnormal findings: Secondary | ICD-10-CM | POA: Diagnosis not present

## 2019-04-11 DIAGNOSIS — Z1239 Encounter for other screening for malignant neoplasm of breast: Secondary | ICD-10-CM | POA: Diagnosis not present

## 2019-04-11 DIAGNOSIS — Z1231 Encounter for screening mammogram for malignant neoplasm of breast: Secondary | ICD-10-CM | POA: Diagnosis not present

## 2019-05-16 ENCOUNTER — Other Ambulatory Visit: Payer: Self-pay | Admitting: Cardiology

## 2019-05-24 ENCOUNTER — Other Ambulatory Visit: Payer: Self-pay

## 2019-05-24 ENCOUNTER — Ambulatory Visit (INDEPENDENT_AMBULATORY_CARE_PROVIDER_SITE_OTHER): Payer: Medicare HMO | Admitting: Cardiology

## 2019-05-24 ENCOUNTER — Encounter: Payer: Self-pay | Admitting: Cardiology

## 2019-05-24 VITALS — BP 145/87 | HR 84 | Temp 97.8°F | Ht 63.0 in | Wt 192.0 lb

## 2019-05-24 DIAGNOSIS — R0600 Dyspnea, unspecified: Secondary | ICD-10-CM | POA: Diagnosis not present

## 2019-05-24 DIAGNOSIS — I10 Essential (primary) hypertension: Secondary | ICD-10-CM

## 2019-05-24 DIAGNOSIS — E785 Hyperlipidemia, unspecified: Secondary | ICD-10-CM

## 2019-05-24 DIAGNOSIS — R0609 Other forms of dyspnea: Secondary | ICD-10-CM

## 2019-05-24 DIAGNOSIS — I421 Obstructive hypertrophic cardiomyopathy: Secondary | ICD-10-CM | POA: Diagnosis not present

## 2019-05-24 MED ORDER — METOPROLOL SUCCINATE ER 100 MG PO TB24: 100.0000 mg | ORAL_TABLET | Freq: Every day | ORAL | 3 refills | Status: DC

## 2019-05-24 NOTE — Progress Notes (Signed)
Primary Physician/Referring:  Bartholome Bill, MD  Patient ID: Renee Kelly, female    DOB: 1956/01/13, 63 y.o.   MRN: [id number]  Chief Complaint  Patient presents with  . Cardiomyopathy  . Hypertension  . Shortness of Breath  . Follow-up    1 yr   HPI:    Renee Kelly  is a 63 y.o.  AAF who was referred to [place] by her PCP for evaluation of cardiac murmur and seen by [place] on 01/01/18. Has past medical history of hypertension that has been managed with metoprolol and amlodipine 10 mg.  Echocardiogram 02/08/28 in revealed severe LVH with LVOT obstruction suggestive of HOCM versus severe hypertensive heart disease. Carotid duplex did not reveal any significant abnormality. Lexiscan nuclear stress test was non-ischemic.  She is orginally from [place] and [place]. She denies any history of rheumatic fever. No previous smoking history.   Her son has an ICD. Patient's son who is 38 years of age presently, has a diagnosis of nonischemic probably alcoholic cardiomyopathy with severe LV systolic dysfunction and has Medtronic ICD placed in [place] in 2018 at age 65.  She does not exercise, but is active with maintaining her house. Mostly cooks at home, but does occasionally have food brought to her by her children. Has mild dyspnea on exertion. Had dizziness about 2 months ago while working in sun. No syncope.  Past Medical History:  Diagnosis Date  . Allergy   . Arthritis    all over per pt.  . Cataract   . GERD (gastroesophageal reflux disease)    past hx only   . Glaucoma   . Heart murmur   . Hypertension   . Lactose intolerance   . Ovary cancer ([place])    ovarian cancer  . Sleep apnea    no cpap   Past Surgical History:  Procedure Laterality Date  . COLONOSCOPY     2 past colonoscopies- 1st 3 polyps, 2nd no polyps- said return 10 yrs   . NSVD     x 2    Social History   Socioeconomic History  . Marital status: Single    Spouse name: Not on file  .  Number of children: 2  . Years of education: Not on file  . Highest education level: Not on file  Occupational History  . Not on file  Social Needs  . Financial resource strain: Not on file  . Food insecurity    Worry: Not on file    Inability: Not on file  . Transportation needs    Medical: Not on file    Non-medical: Not on file  Tobacco Use  . Smoking status: Never Smoker  . Smokeless tobacco: Never Used  Substance and Sexual Activity  . Alcohol use: No  . Drug use: No  . Sexual activity: Not on file  Lifestyle  . Physical activity    Days per week: Not on file    Minutes per session: Not on file  . Stress: Not on file  Relationships  . Social Herbalist on phone: Not on file    Gets together: Not on file    Attends religious service: Not on file    Active member of club or organization: Not on file    Attends meetings of clubs or organizations: Not on file    Relationship status: Not on file  . Intimate partner violence    Fear of current or  ex partner: Not on file    Emotionally abused: Not on file    Physically abused: Not on file    Forced sexual activity: Not on file  Other Topics Concern  . Not on file  Social History Narrative   Lives at home alone   Right handed    Sometimes drinks tea   ROS  Review of Systems  Constitution: Negative for chills, decreased appetite, malaise/fatigue and weight gain.  Cardiovascular: Positive for dyspnea on exertion. Negative for leg swelling and syncope.  Endocrine: Negative for cold intolerance.  Hematologic/Lymphatic: Does not bruise/bleed easily.  Musculoskeletal: Negative for joint swelling.  Gastrointestinal: Negative for abdominal pain, anorexia, change in bowel habit, hematochezia and melena.  Neurological: Negative for headaches and light-headedness.  Psychiatric/Behavioral: Negative for depression and substance abuse.  All other systems reviewed and are negative.  Objective   Vitals with BMI  05/24/2019 02/15/2019 02/15/2019  Height 5\' 3"  - -  Weight 192 lbs - -  BMI [id number] - -  Systolic [id number]  Diastolic 87 90 68  Pulse 84 68 70      Physical Exam  Constitutional: She appears well-developed and well-nourished. No distress.  HENT:  Head: Atraumatic.  Eyes: Conjunctivae are normal.  Neck: Neck supple. No thyromegaly present.  Cardiovascular: Normal rate, regular rhythm, S1 normal, S2 normal, intact distal pulses and normal pulses. Exam reveals no gallop.  Murmur heard.  Harsh mid to late systolic murmur is present with a grade of 2/6 at the upper right sternal border and apex. Pulses:      Carotid pulses are on the right side with bruit and on the left side with bruit. No leg edema, no JVD.   Pulmonary/Chest: Effort normal and breath sounds normal.  Abdominal: Soft. Bowel sounds are normal.  Musculoskeletal: Normal range of motion.  Neurological: She is alert.  Skin: Skin is warm and dry.  Psychiatric: She has a normal mood and affect.    Laboratory examination:   CBC and Differential (03/25/2019 11:38 AM EDT) CBC and Differential (03/25/2019 11:38 AM EDT)  Component Value Ref Range Performed At Pathologist Signature  WBC 7.1 4.0 - 10.5 x 10*3/uL [facility]   RBC 4.40 3.87 - 5.11 x 10*6/uL [facility]   Hemoglobin 13.7 12.0 - 15.0 G/DL [facility]   Hematocrit 41.1 36.0 - 46.0 % [facility]   MCV 93.4 78.0 - 100.0 FL [facility]   MCH 31.2 26.0 - 34.0 PG [facility]   MCHC 33.4 30.0 - 36.0 G/DL [facility]   RDW 13.5 11.5 - 15.5 % [facility]   Platelets 240 150 - 400 X 10*3/uL [facility]      Comprehensive Metabolic PanelResulted: [id number] 6:50 PM [facility] Component Name Value Ref Range  Sodium 141 135 - 146 MMOL/L  Potassium 4.2 3.5 - 5.3 MMOL/L  Chloride 105 98 - 110 MMOL/L  CO2 28 23 - 30  MMOL/L  BUN 13 8 - 24 MG/DL  Glucose 99 70 - 99 MG/DL  Creatinine 0.86 0.5 - 1.5 MG/DL  Calcium 9.5 8.5 - 10.5 MG/DL  Total Protein 7.5 6 - 8.3 G/DL  Albumin  4.4 3.5 - 5 G/DL  Total Bilirubin 0.6 0.1 - 1.2 MG/DL  Alkaline Phosphatase 68 25 - 125 IU/L or U/L  AST (SGOT) 18 5 - 40 IU/L or U/L  ALT (  SGPT) 13 5 - 50 IU/L or U/L  Anion Gap 8 4 - 14 MMOL/L  Est. GFR African American 83  Comment: GFR estimated by CKD-EPI equations, reportable up to 90 ML/MIN/1.73 M*2 >=60 ML/MIN/1.73 M*2    Lipid ProfileResulted: 03/25/2019 6:50 PM [facility] Component Name Value Ref Range  Total Cholesterol 183 25 - 199 MG/DL  Triglycerides 100 10 - 150 MG/DL  HDL Cholesterol 56 35 - 135 MG/DL  LDL Cholesterol 107    Hemoglobin A1CResulted: 03/28/2018 6:59 PM [facility] Component Name Value Ref Range  HEMOGLOBIN A1C 5.8 (H)  Comment:  Normal:    Less than 5.7% Prediabetes: 5.7% to 6.4% Diabetes:   Greater than 6.4% <5.7 %  eAG 120 MG/DL   TSH normal in 2018.  03/28/2018 BNP 61.0   Medications and allergies  No Known Allergies   Current Outpatient Medications  Medication Instructions  . amLODipine (NORVASC) 10 MG tablet Take 1 tablet by mouth once daily  . Cholecalciferol (VITAMIN D3) 1000 units CAPS Oral  . Cyanocobalamin (VITAMIN B 12 PO) 2 tablets, Oral, Daily  . LUMIGAN 0.01 % SOLN INSTILL 1 DROP INTO EACH EYE AT BEDTIME  . metoprolol succinate (TOPROL-XL) 100 mg, Oral, Daily, Take with or immediately following a meal.  . Omega-3 Fatty Acids (FISH OIL) 1000 MG CAPS Oral  . OVER THE COUNTER MEDICATION 2 tablets, Oral, Daily, OMEGA XL     Radiology:  No results found. Cardiac Studies:   Carotid artery duplex 03-02-2018: Minimal stenosis in bilateral internal carotid artery (1-15%). Antegrade right vertebral artery flow. Antegrade left vertebral artery flow.  Echocardiogram 03-02-2018: Left ventricle cavity is normal in size.  Severe concentric hypertrophy of the left ventricle, both septal and posterior wall measuring 1.8 cm in AP dimension. Normal global wall motion. Doppler evidence of grade I (impaired) diastolic dysfunction, normal LAP. Calculated EF 58%. Increased LVOT velocity due to severe LVH. This is the likely cause of systolic murmur. Mild tricuspid regurgitation. Estimated pulmonary artery systolic pressure 25 mmHg.  Lexiscan myoview stress test 03/02/2018: 1. Lexiscan stress test was performed. Exercise capacity was not assessed. Stress symptoms included dizziness. Blood pressure was elevated throughout the study. The resting and stress electrocardiogram demonstrated normal sinus rhythm, inferolateral T wave inversions, normal resting conduction, and no resting arrhythmias. Stress EKG is non diagnostic for ischemia as it is a pharmacologic stress. 2. The overall quality of the study is excellent. There is no evidence of abnormal lung activity. Stress and rest SPECT images demonstrate homogeneous tracer distribution throughout the myocardium. Gated SPECT imaging reveals normal myocardial thickening and wall motion. The left ventricular ejection fraction was normal (63%).  3. Low risk study.  Assessment     ICD-10-CM   1. Obstructive hypertrophic cardiomyopathy (HCC)  I42.1 EKG 12-Lead    metoprolol succinate (TOPROL-XL) 100 MG 24 hr tablet    MR CARDIAC MORPHOLOGY W WO CONTRAST  2. Essential hypertension  I10 metoprolol succinate (TOPROL-XL) 100 MG 24 hr tablet  3. Dyspnea on exertion  R06.00 MR CARDIAC MORPHOLOGY W WO CONTRAST  4. Mild hyperlipidemia  E78.5     EKG 05/24/2019: Normal sinus rhythm at rate of 75 bpm, left atrial enlargement, LVH with repolarization abnormality, cannot exclude anterolateral ischemia. No significant change from  EKG 05/16/2018  Recommendations:   Meds ordered this encounter  Medications  . metoprolol succinate (TOPROL-XL) 100 MG 24 hr tablet    Sig: Take 1 tablet (100  mg total) by  mouth daily. Take with or immediately following a meal.    Dispense:  90 tablet    Refill:  3    Renee Kelly  is a 63 y.o.  AAF who was referred to [place] by her PCP for evaluation of cardiac murmur and seen by [place] on 01/01/18. Has past medical history of hypertension that has been managed with metoprolol and amlodipine 10 mg.  Echocardiogram 02/08/28 in revealed severe LVH with LVOT obstruction suggestive of HOCM versus severe hypertensive heart disease. Carotid duplex did not reveal any significant abnormality. Lexiscan nuclear stress test was non-ischemic.  Her son has an ICD. Patient's son who is 42 years of age presently, has a diagnosis of nonischemic probably alcoholic cardiomyopathy with severe LV systolic dysfunction and has [employer]tr[employer] ICD placed in [place], [place] in 2018 at age 69.  Patient was seen for dizziness when she was dehydrated 2 months ago by her PCP.  I do not suspect syncope.  In view of severe LVH, abnormal EKG, I have recommended cardiac MR.  Would like to see her back in 6 weeks.  Patient is presently on metoprolol tartrate 25 mg once a day, to improve compliance, I will change it to metoprolol succinate but will increase to 100 mg daily both in view of LVOT obstruction and elevated blood pressure today.  I reviewed her labs from PCP.  She does have mild hyperlipidemia and may need to consider small dose statin. "Total time spent with patient was  40 minutes and greater than 50% of that time was spent in face to face discussion, counseling and coordination care"   Adrian Prows, MD, [facility] 05/24/2019, 7:02 PM [facility] [place] Pager: [phone] Office: [phone] If no answer Cell [phone]

## 2019-05-24 NOTE — Patient Instructions (Signed)
You can take Metoprolol Tartarate 25 mg two tablets twice daily until done and then pick up  Metoprolol Succinate 100 mg and take once a day.

## 2019-07-24 DIAGNOSIS — H401111 Primary open-angle glaucoma, right eye, mild stage: Secondary | ICD-10-CM | POA: Diagnosis not present

## 2019-07-24 DIAGNOSIS — H401122 Primary open-angle glaucoma, left eye, moderate stage: Secondary | ICD-10-CM | POA: Diagnosis not present

## 2019-07-24 DIAGNOSIS — H2513 Age-related nuclear cataract, bilateral: Secondary | ICD-10-CM | POA: Diagnosis not present

## 2019-07-24 DIAGNOSIS — H04123 Dry eye syndrome of bilateral lacrimal glands: Secondary | ICD-10-CM | POA: Diagnosis not present

## 2019-07-26 DIAGNOSIS — I10 Essential (primary) hypertension: Secondary | ICD-10-CM | POA: Diagnosis not present

## 2019-08-14 ENCOUNTER — Other Ambulatory Visit: Payer: Self-pay | Admitting: Cardiology

## 2019-08-19 ENCOUNTER — Other Ambulatory Visit: Payer: Self-pay | Admitting: Cardiology

## 2019-09-05 ENCOUNTER — Other Ambulatory Visit: Payer: Self-pay

## 2019-09-05 ENCOUNTER — Encounter (HOSPITAL_COMMUNITY): Payer: Self-pay | Admitting: Emergency Medicine

## 2019-09-05 ENCOUNTER — Emergency Department (HOSPITAL_COMMUNITY)
Admission: EM | Admit: 2019-09-05 | Discharge: 2019-09-05 | Disposition: A | Discharge status: Home / Self Care | Payer: Medicare HMO | Attending: Emergency Medicine | Admitting: Emergency Medicine

## 2019-09-05 ENCOUNTER — Emergency Department (HOSPITAL_COMMUNITY): Payer: Medicare HMO

## 2019-09-05 DIAGNOSIS — I10 Essential (primary) hypertension: Secondary | ICD-10-CM | POA: Diagnosis not present

## 2019-09-05 DIAGNOSIS — R42 Dizziness and giddiness: Secondary | ICD-10-CM

## 2019-09-05 DIAGNOSIS — Z79899 Other long term (current) drug therapy: Secondary | ICD-10-CM | POA: Insufficient documentation

## 2019-09-05 DIAGNOSIS — R110 Nausea: Secondary | ICD-10-CM | POA: Insufficient documentation

## 2019-09-05 DIAGNOSIS — Z8543 Personal history of malignant neoplasm of ovary: Secondary | ICD-10-CM | POA: Diagnosis not present

## 2019-09-05 LAB — URINALYSIS, ROUTINE W REFLEX MICROSCOPIC
Bilirubin Urine: NEGATIVE
Glucose, UA: NEGATIVE mg/dL
Hgb urine dipstick: NEGATIVE
Ketones, ur: NEGATIVE mg/dL
Leukocytes,Ua: NEGATIVE
Nitrite: NEGATIVE
Protein, ur: NEGATIVE mg/dL
Specific Gravity, Urine: 1.005 (ref 1.005–1.030)
pH: 6 (ref 5.0–8.0)

## 2019-09-05 LAB — BASIC METABOLIC PANEL
Anion gap: 9 (ref 5–15)
BUN: 10 mg/dL (ref 8–23)
CO2: 25 mmol/L (ref 22–32)
Calcium: 9.7 mg/dL (ref 8.9–10.3)
Chloride: 103 mmol/L (ref 98–111)
Creatinine, Ser: 0.94 mg/dL (ref 0.44–1.00)
GFR calc Af Amer: >60 mL/min (ref >60)
GFR calc non Af Amer: >60 mL/min (ref >60)
Glucose, Bld: 172 mg/dL — ABNORMAL HIGH (ref 70–99)
Potassium: 3.8 mmol/L (ref 3.5–5.1)
Sodium: 137 mmol/L (ref 135–145)

## 2019-09-05 LAB — CBC
HCT: 42.5 % (ref 36.0–46.0)
Hemoglobin: 13.8 g/dL (ref 12.0–15.0)
MCH: 30.7 pg (ref 26.0–34.0)
MCHC: 32.5 g/dL (ref 30.0–36.0)
MCV: 94.4 fL (ref 80.0–100.0)
Platelets: 285 10*3/uL (ref 150–400)
RBC: 4.5 MIL/uL (ref 3.87–5.11)
RDW: 12.1 % (ref 11.5–15.5)
WBC: 8.1 10*3/uL (ref 4.0–10.5)
nRBC: 0 % (ref 0.0–0.2)

## 2019-09-05 LAB — CBG MONITORING, ED: Glucose-Capillary: 168 mg/dL — ABNORMAL HIGH (ref 70–99)

## 2019-09-05 MED ORDER — MECLIZINE HCL 25 MG PO TABS
25.0000 mg | ORAL_TABLET | Freq: Once | ORAL | Status: AC
  Administered 2019-09-05: 25 mg via ORAL
  Filled 2019-09-05: qty 1

## 2019-09-05 MED ORDER — SODIUM CHLORIDE 0.9% FLUSH: 3.0000 mL | Freq: Once | INTRAVENOUS | Status: DC

## 2019-09-05 MED ORDER — LORAZEPAM 1 MG PO TABS
0.5000 mg | ORAL_TABLET | Freq: Once | ORAL | Status: AC
  Administered 2019-09-05: 17:00:00 0.5 mg via ORAL
  Filled 2019-09-05: qty 1

## 2019-09-05 MED ORDER — MECLIZINE HCL 25 MG PO TABS: 25.0000 mg | ORAL_TABLET | Freq: Three times a day (TID) | ORAL | 0 refills | Status: DC | PRN

## 2019-09-05 NOTE — ED Notes (Signed)
Urine culture attached to sample  °

## 2019-09-05 NOTE — Discharge Instructions (Signed)
Your sugar today was 170.  Follow-up with your doctor for it.  Follow-up with ear nose and throat for the vertigo.

## 2019-09-05 NOTE — ED Notes (Signed)
Pt ambulated down hallway and back. Stopped at restroom on way back to room and pt had one episode of dizziness and had to hold onto nurse for stability. Was able to regain balance and continue back to room with no problem.

## 2019-09-05 NOTE — ED Triage Notes (Signed)
Pt in POV. Reports dizziness onset last night. States it is worse when she turns her head. Denies HA, blurred vision, numbness/tingling, weakness.

## 2019-09-05 NOTE — ED Provider Notes (Signed)
[facility] EMERGENCY DEPARTMENT Provider Note   CSN: [id number] Arrival date & time: 09/05/19  1514     History Chief Complaint  Patient presents with  . Dizziness    Renee Kelly is a 64 y.o. female.  HPI Patient presents with dizziness.  Began last night.  States it feels as if the room is moving around.  Has had some nausea.  Worse if she sits up or moves her head.  If she tries to walk she will feel as though she could fall over.  Does not appear to feel lightheaded.  No chest pain.  No trouble breathing.  No headache.  Has not had symptoms like this before.    Past Medical History:  Diagnosis Date  . Allergy   . Arthritis    all over per pt.  . Cataract   . GERD (gastroesophageal reflux disease)    past hx only   . Glaucoma   . Heart murmur   . Hypertension   . Lactose intolerance   . Ovary cancer ([place])    ovarian cancer  . Sleep apnea    no cpap    Patient Active Problem List   Diagnosis Date Noted  . Ovary cancer ([place])   . Hypertension   . Anosmia 05/09/2017    Past Surgical History:  Procedure Laterality Date  . COLONOSCOPY     2 past colonoscopies- 1st 3 polyps, 2nd no polyps- said return 10 yrs   . NSVD     x 2      OB History   No obstetric history on file.     Family History  Problem Relation Age of Onset  . Alzheimer's disease Mother   . Liver disease Father   . Colon polyps Sister   . Colon cancer Neg Hx   . Esophageal cancer Neg Hx   . Rectal cancer Neg Hx   . Stomach cancer Neg Hx     Social History   Tobacco Use  . Smoking status: Never Smoker  . Smokeless tobacco: Never Used  Substance Use Topics  . Alcohol use: No  . Drug use: No    Home Medications Prior to Admission medications   Medication Sig Start Date End Date Taking? Authorizing Provider  amLODipine (NORVASC) 10 MG tablet Take 1 tablet by mouth once daily 08/19/19   Adrian Prows, MD  Cholecalciferol (VITAMIN D3) 1000 units CAPS Take by mouth.     [provider]  Cyanocobalamin (VITAMIN B 12 PO) Take 2 tablets by mouth daily.    [provider]  LUMIGAN 0.01 % SOLN INSTILL 1 DROP INTO EACH EYE AT BEDTIME 11/23/18   [provider]  meclizine (ANTIVERT) 25 MG tablet Take 1 tablet (25 mg total) by mouth 3 (three) times daily as needed for dizziness. 09/05/19   Davonna Belling, MD  metoprolol succinate (TOPROL-XL) 100 MG 24 hr tablet Take 1 tablet (100 mg total) by mouth daily. Take with or immediately following a meal. 05/24/19 08/22/19  Adrian Prows, MD  Omega-3 Fatty Acids (FISH OIL) 1000 MG CAPS Take by mouth.    [provider]  OVER THE COUNTER MEDICATION Take 2 tablets by mouth daily. OMEGA XL    [provider]    Allergies    Patient has no known allergies.  Review of Systems   Review of Systems  Constitutional: Negative for appetite change.  HENT: Negative for congestion.   Respiratory: Negative for shortness of breath.  Cardiovascular: Negative for chest pain.  Gastrointestinal: Positive for nausea.  Genitourinary: Negative for flank pain.  Musculoskeletal: Negative for back pain.  Skin: Negative for rash.  Neurological: Positive for dizziness. Negative for headaches.    Physical Exam Updated Vital Signs BP (!) 142/80 (BP Location: Right Arm)   Pulse (!) 51   Temp 99 F (37.2 C) (Oral)   Resp (!) 24   Ht 5\' 4"  (1.626 m)   Wt 88.5 kg   SpO2 98%   BMI 33.47 kg/m   Physical Exam Vitals and nursing note reviewed.  HENT:     Head: Atraumatic.  Eyes:     Comments: Extraocular movements intact.  Does have nystagmus with gaze to the left and right.  Cardiovascular:     Rate and Rhythm: Regular rhythm.  Pulmonary:     Breath sounds: No wheezing or rhonchi.  Abdominal:     Tenderness: There is no abdominal tenderness.  Musculoskeletal:     Cervical back: Neck supple.  Skin:    Capillary Refill: Capillary refill takes less than 2 seconds.  Neurological:     Mental  Status: She is alert.     Comments: Face symmetric.  Finger nose intact.  Eye movements intact.  Some nystagmus with gaze left and right.  Psychiatric:        Mood and Affect: Mood normal.     ED Results / Procedures / Treatments   Labs (all labs ordered are listed, but only abnormal results are displayed) Labs Reviewed  BASIC METABOLIC PANEL - Abnormal; Notable for the following components:      Result Value   Glucose, Bld 172 (*)    All other components within normal limits  CBG MONITORING, ED - Abnormal; Notable for the following components:   Glucose-Capillary 168 (*)    All other components within normal limits  CBC  URINALYSIS, ROUTINE W REFLEX MICROSCOPIC    EKG EKG Interpretation  Date/Time:  Thursday September 05 2019 15:20:25 EST Ventricular Rate:  62 PR Interval:  146 QRS Duration: 74 QT Interval:  374 QTC Calculation: 379 R Axis:   52 Text Interpretation: Normal sinus rhythm Septal infarct , age undetermined T wave abnormality, consider lateral ischemia Abnormal ECG No significant change since last tracing Confirmed by Davonna Belling [phone]) on 09/05/2019 4:33:12 PM   Radiology MR BRAIN WO CONTRAST  Result Date: 09/05/2019 CLINICAL DATA:  Vertigo, central. Additional history provided: Patient reports dizziness onset last night, worse when turning head. EXAM: MRI HEAD WITHOUT CONTRAST TECHNIQUE: Multiplanar, multiecho pulse sequences of the brain and surrounding structures were obtained without intravenous contrast. COMPARISON:  Report from MRI/MRA head 06/06/2017 (images unavailable), brain MRI 11/02/2016 FINDINGS: Brain: Mild intermittent motion degradation. There is no evidence of acute infarct. No evidence of intracranial mass. No midline shift or extra-axial fluid collection. No chronic intracranial blood products. Scattered T2/FLAIR hyperintensity within the cerebral white matter is nonspecific, but most commonly seen on the basis of chronic small vessel  ischemic disease. Findings have slightly progressed since prior MRI 11/02/2016. Cerebral volume is normal for age. Vascular: Flow voids maintained within the proximal large arterial vessels. Skull and upper cervical spine: No focal marrow lesion. Sinuses/Orbits: Visualized orbits demonstrate no acute abnormality. Mucous retention cyst versus small air-fluid level within the left frontal sinus. Additional mild scattered paranasal sinus mucosal thickening. No significant mastoid effusion. IMPRESSION: 1. No evidence of acute intracranial abnormality, including acute infarction. 2. Chronic small vessel ischemic disease, slightly progressed from MRI 11/02/2016. 3.  Mucous retention cyst versus small air-fluid level within the left frontal sinus. Electronically Signed   By: Kellie Simmering DO   On: 09/05/2019 18:44    Procedures Procedures (including critical care time)  Medications Ordered in ED Medications  sodium chloride flush (NS) 0.9 % injection 3 mL (3 mLs Intravenous Not Given 09/05/19 1922)  meclizine (ANTIVERT) tablet 25 mg (25 mg Oral Given 09/05/19 1706)  LORazepam (ATIVAN) tablet 0.5 mg (0.5 mg Oral Given 09/05/19 1706)    ED Course  I have reviewed the triage vital signs and the nursing notes.  Pertinent labs & imaging results that were available during my care of the patient were reviewed by me and considered in my medical decision making (see chart for details).    MDM Rules/Calculators/A&P                      Patient with vertigo.  Feels the room spinning.  Somewhat worrisome for central cause.  MRI done reassuring however.  Feels better after Antivert treatment but still mildly unsteady.  Able to ambulate.  Will discharge home with ENT follow-up and symptomatic treatment.  Mild hyperglycemia also will need to be followed as an outpatient. Final Clinical Impression(s) / ED Diagnoses Final diagnoses:  Vertigo    Rx / DC Orders ED Discharge Orders         Ordered    meclizine  (ANTIVERT) 25 MG tablet  3 times daily PRN     09/05/19 2015           Davonna Belling, MD 09/05/19 2343

## 2019-09-05 NOTE — ED Notes (Signed)
Pt returned from MRI °

## 2019-09-05 NOTE — ED Notes (Signed)
Pt states still feeling dizzy after medication admin.

## 2019-09-12 DIAGNOSIS — H903 Sensorineural hearing loss, bilateral: Secondary | ICD-10-CM | POA: Diagnosis not present

## 2019-09-12 DIAGNOSIS — R42 Dizziness and giddiness: Secondary | ICD-10-CM | POA: Diagnosis not present

## 2019-09-12 DIAGNOSIS — H8112 Benign paroxysmal vertigo, left ear: Secondary | ICD-10-CM | POA: Diagnosis not present

## 2019-09-17 DIAGNOSIS — H409 Unspecified glaucoma: Secondary | ICD-10-CM | POA: Diagnosis not present

## 2019-09-17 DIAGNOSIS — Z809 Family history of malignant neoplasm, unspecified: Secondary | ICD-10-CM | POA: Diagnosis not present

## 2019-09-17 DIAGNOSIS — Z6834 Body mass index (BMI) 34.0-34.9, adult: Secondary | ICD-10-CM | POA: Diagnosis not present

## 2019-09-17 DIAGNOSIS — R32 Unspecified urinary incontinence: Secondary | ICD-10-CM | POA: Diagnosis not present

## 2019-09-17 DIAGNOSIS — I10 Essential (primary) hypertension: Secondary | ICD-10-CM | POA: Diagnosis not present

## 2019-09-17 DIAGNOSIS — I509 Heart failure, unspecified: Secondary | ICD-10-CM | POA: Diagnosis not present

## 2019-09-17 DIAGNOSIS — Z8249 Family history of ischemic heart disease and other diseases of the circulatory system: Secondary | ICD-10-CM | POA: Diagnosis not present

## 2019-09-17 DIAGNOSIS — R52 Pain, unspecified: Secondary | ICD-10-CM | POA: Diagnosis not present

## 2019-09-17 DIAGNOSIS — I739 Peripheral vascular disease, unspecified: Secondary | ICD-10-CM | POA: Diagnosis not present

## 2019-09-17 DIAGNOSIS — E669 Obesity, unspecified: Secondary | ICD-10-CM | POA: Diagnosis not present

## 2019-09-17 DIAGNOSIS — Z008 Encounter for other general examination: Secondary | ICD-10-CM | POA: Diagnosis not present

## 2019-09-19 ENCOUNTER — Telehealth: Payer: Self-pay | Admitting: *Deleted

## 2019-09-19 ENCOUNTER — Encounter: Payer: Self-pay | Admitting: *Deleted

## 2019-09-19 NOTE — Telephone Encounter (Signed)
Spoke with patient regarding appointment for Cardiac MRI scheduled  Monday 10/28/19 at 8:00 am at [facility]---arrival time is 7:15 am 1st floor radiology.  Will mail information to patient.

## 2019-09-30 DIAGNOSIS — H8113 Benign paroxysmal vertigo, bilateral: Secondary | ICD-10-CM | POA: Diagnosis not present

## 2019-10-28 ENCOUNTER — Other Ambulatory Visit: Payer: Self-pay

## 2019-10-28 ENCOUNTER — Ambulatory Visit (HOSPITAL_COMMUNITY)
Admission: RE | Admit: 2019-10-28 | Discharge: 2019-10-28 | Disposition: A | Discharge status: Home / Self Care | Payer: Medicare HMO | Source: Ambulatory Visit | Attending: Cardiology | Admitting: Cardiology

## 2019-10-28 DIAGNOSIS — R0600 Dyspnea, unspecified: Secondary | ICD-10-CM

## 2019-10-28 DIAGNOSIS — R0609 Other forms of dyspnea: Secondary | ICD-10-CM

## 2019-10-28 DIAGNOSIS — I421 Obstructive hypertrophic cardiomyopathy: Secondary | ICD-10-CM | POA: Diagnosis not present

## 2019-10-28 MED ORDER — GADOBUTROL 1 MMOL/ML IV SOLN
6.0000 mL | Freq: Once | INTRAVENOUS | Status: AC | PRN
  Administered 2019-10-28: 6 mL via INTRAVENOUS

## 2019-12-18 DIAGNOSIS — H401111 Primary open-angle glaucoma, right eye, mild stage: Secondary | ICD-10-CM | POA: Diagnosis not present

## 2019-12-18 DIAGNOSIS — H401122 Primary open-angle glaucoma, left eye, moderate stage: Secondary | ICD-10-CM | POA: Diagnosis not present

## 2020-01-24 DIAGNOSIS — I421 Obstructive hypertrophic cardiomyopathy: Secondary | ICD-10-CM | POA: Diagnosis not present

## 2020-01-24 DIAGNOSIS — I10 Essential (primary) hypertension: Secondary | ICD-10-CM | POA: Diagnosis not present

## 2020-02-10 ENCOUNTER — Encounter: Payer: Self-pay | Admitting: Cardiology

## 2020-02-10 ENCOUNTER — Other Ambulatory Visit: Payer: Self-pay

## 2020-02-10 ENCOUNTER — Ambulatory Visit (INDEPENDENT_AMBULATORY_CARE_PROVIDER_SITE_OTHER): Payer: Medicare HMO | Admitting: Cardiology

## 2020-02-10 VITALS — BP 160/87 | HR 69 | Resp 16 | Ht 64.0 in | Wt 195.0 lb

## 2020-02-10 DIAGNOSIS — R0609 Other forms of dyspnea: Secondary | ICD-10-CM

## 2020-02-10 DIAGNOSIS — R0600 Dyspnea, unspecified: Secondary | ICD-10-CM

## 2020-02-10 DIAGNOSIS — G4733 Obstructive sleep apnea (adult) (pediatric): Secondary | ICD-10-CM

## 2020-02-10 DIAGNOSIS — I10 Essential (primary) hypertension: Secondary | ICD-10-CM

## 2020-02-10 DIAGNOSIS — I421 Obstructive hypertrophic cardiomyopathy: Secondary | ICD-10-CM

## 2020-02-10 MED ORDER — METOPROLOL SUCCINATE ER 100 MG PO TB24: 100.0000 mg | ORAL_TABLET | Freq: Every day | ORAL | 3 refills | Status: DC

## 2020-02-10 MED ORDER — AMLODIPINE BESYLATE 10 MG PO TABS: 10.0000 mg | ORAL_TABLET | Freq: Every day | ORAL | 3 refills | Status: DC

## 2020-02-10 MED ORDER — VALSARTAN-HYDROCHLOROTHIAZIDE 160-12.5 MG PO TABS: 1.0000 | ORAL_TABLET | Freq: Every day | ORAL | 2 refills | Status: DC

## 2020-02-10 NOTE — Progress Notes (Signed)
Primary Physician/Referring:  Bartholome Bill, MD  Patient ID: Renee Kelly, female    DOB: 10-02-1955, 64 y.o.   MRN: [id number]  Chief Complaint  Patient presents with  . Hypertension  . Results    MRI  . Follow-up    8 month   HPI:    Renee Kelly  is a 64 y.o.  AAF who was referred to [place] by her PCP for evaluation of cardiac murmur and seen by [place] on 01/01/18. Has past medical history of hypertension that has been managed with metoprolol and amlodipine 10 mg.  Echocardiogram 02/08/28 in revealed severe LVH with LVOT obstruction suggestive of HOCM versus severe hypertensive heart disease. Carotid duplex did not reveal any significant abnormality. Lexiscan nuclear stress test was non-ischemic. MRI in Apr 2021 confirmed HOCM.  She is orginally from [place] and moved [place].  Her son has alcoholic cardiomyopathy and has an ICD in place.  She now presents for annual visit.  Except for chronic dyspnea denies PND or orthopnea, no leg edema, no dizziness or syncope or palpitations.  Past Medical History:  Diagnosis Date  . Allergy   . Arthritis    all over per pt.  . Cataract   . GERD (gastroesophageal reflux disease)    past hx only   . Glaucoma   . Heart murmur   . Hypertension   . Lactose intolerance   . Ovary cancer ([place])    ovarian cancer  . Sleep apnea    no cpap   Past Surgical History:  Procedure Laterality Date  . COLONOSCOPY     2 past colonoscopies- 1st 3 polyps, 2nd no polyps- said return 10 yrs   . NSVD     x 2    Social History   Socioeconomic History  . Marital status: Single    Spouse name: Not on file  . Number of children: 2  . Years of education: Not on file  . Highest education level: Not on file  Occupational History  . Not on file  Tobacco Use  . Smoking status: Never Smoker  . Smokeless tobacco: Never Used  Vaping Use  . Vaping Use: Never used  Substance and Sexual Activity  . Alcohol use: No  . Drug use: No  . Sexual  activity: Not on file  Other Topics Concern  . Not on file  Social History Narrative   Lives at home alone   Right handed    Sometimes drinks tea   Social Determinants of Health   Financial Resource Strain:   . Difficulty of Paying Living Expenses:   Food Insecurity:   . Worried About Charity fundraiser in the Last Year:   . Arboriculturist in the Last Year:   Transportation Needs:   . Film/video editor (Medical):   Marland Kitchen Lack of Transportation (Non-Medical):   Physical Activity:   . Days of Exercise per Week:   . Minutes of Exercise per Session:   Stress:   . Feeling of Stress :   Social Connections:   . Frequency of Communication with Friends and Family:   . Frequency of Social Gatherings with Friends and Family:   . Attends Religious Services:   . Active Member of Clubs or Organizations:   . Attends Archivist Meetings:   Marland Kitchen Marital Status:   Intimate Partner Violence:   . Fear of Current or Ex-Partner:   . Emotionally Abused:   .  Physically Abused:   . Sexually Abused:    ROS  Review of Systems  Cardiovascular: Positive for dyspnea on exertion. Negative for chest pain and leg swelling.  Gastrointestinal: Negative for melena.   Objective   Vitals with BMI 02/10/2020 09/05/2019 09/05/2019  Height 5\' 4"  - -  Weight 195 lbs - -  BMI 52.77 - -  Systolic 824 235 361  Diastolic 87 80 72  Pulse 69 51 -      Physical Exam Constitutional:      General: She is not in acute distress.    Appearance: She is well-developed. She is obese.  Eyes:     Conjunctiva/sclera: Conjunctivae normal.  Neck:     Thyroid: No thyromegaly.  Cardiovascular:     Rate and Rhythm: Normal rate and regular rhythm.     Pulses: Normal pulses and intact distal pulses.     Heart sounds: S1 normal and S2 normal. Murmur heard.  Harsh mid to late systolic murmur is present with a grade of 2/6 at the upper right sternal border and apex.  No gallop.      Comments: No leg edema, no JVD.   Pulmonary:     Effort: Pulmonary effort is normal.     Breath sounds: Normal breath sounds.  Abdominal:     General: Bowel sounds are normal.     Palpations: Abdomen is soft.    Laboratory examination:   External labs:  BMP Latest Ref Rng & Units 09/05/2019 05/09/2017 11/02/2016  Glucose 70 - 99 mg/dL 172(H) 105(H) 107(H)  BUN 8 - 23 mg/dL 10 16 11   Creatinine 0.44 - 1.00 mg/dL 0.94 0.88 0.86  BUN/Creat Ratio 12 - 28 - 18 -  Sodium 135 - 145 mmol/L 137 143 137  Potassium 3.5 - 5.1 mmol/L 3.8 4.3 3.7  Chloride 98 - 111 mmol/L 103 104 103  CO2 22 - 32 mmol/L 25 31(H) 26  Calcium 8.9 - 10.3 mg/dL 9.7 9.7 9.3    CBC Latest Ref Rng & Units 09/05/2019 05/09/2017 11/02/2016  WBC 4.0 - 10.5 K/uL 8.1 6.5 7.9  Hemoglobin 12.0 - 15.0 g/dL 13.8 13.4 13.5  Hematocrit 36 - 46 % 42.5 41.9 41.4  Platelets 150 - 400 K/uL 285 233 248    Lipid ProfileResulted: 03/25/2019 6:50 PM [facility] Component Name Value Ref Range  Total Cholesterol 183 25 - 199 MG/DL  Triglycerides 100 10 - 150 MG/DL  HDL Cholesterol 56 35 - 135 MG/DL  LDL Cholesterol 107    Hemoglobin A1CResulted: 03/28/2018 6:59 PM [facility] Component Name Value Ref Range  HEMOGLOBIN A1C 5.8 (H)  Comment:  Normal:    Less than 5.7% Prediabetes: 5.7% to 6.4% Diabetes:   Greater than 6.4% <5.7 %  eAG 120 MG/DL   Medications and allergies  No Known Allergies   Current Outpatient Medications  Medication Instructions  . amLODipine (NORVASC) 10 mg, Oral, Daily  . Cholecalciferol (VITAMIN D3) 1000 units CAPS Oral  . Cyanocobalamin (VITAMIN B 12 PO) 2 tablets, Oral, Daily  . LUMIGAN 0.01 % SOLN INSTILL 1 DROP INTO EACH EYE AT BEDTIME  . metoprolol succinate (TOPROL-XL) 100 mg, Oral, Daily, Take with or immediately following a meal.  . Omega-3 Fatty Acids (FISH OIL) 1000 MG CAPS Oral  . valsartan-hydrochlorothiazide (DIOVAN HCT) 160-12.5 MG tablet 1 tablet, Oral, Daily     Radiology:   Cardiac MR 10/30/2019: 1. Normal left ventricular size, with severe basal septal hypertrophy and  moderate hypertrophy of the residual myocardium and hyperdynamic systolic function (LVEF = 74%) with gradient across the LVOT. There is a small apical aneurysm slightly enlarging in systole. No late gadolinium enhancement is seen in the left ventricular myocardium. 2. Normal right ventricular size, thickness and systolic function (LVEF = 66%). There are no regional wall motion abnormalities. 3. Moderate to severe left atrial dilatation. 4. Normal size of the aortic root, ascending aorta and pulmonary artery. 5. Mild to moderate mitral and trivial tricuspid regurgitation. 6. Normal pericardium. Trivial pericardial effusion.  These findings are consistent with a typical form of hypertrophic cardiomyopathy with no late gadolinium enhancement and a small apical aneurysm.  Cardiac Studies:   Carotid artery duplex Feb 18, 2018: Minimal stenosis in bilateral internal carotid artery (1-15%). Antegrade right vertebral artery flow. Antegrade left vertebral artery flow.  Echocardiogram Feb 18, 2018: Left ventricle cavity is normal in size. Severe concentric hypertrophy of the left ventricle, both septal and posterior wall measuring 1.8 cm in AP dimension. Normal global wall motion. Doppler evidence of grade I (impaired) diastolic dysfunction, normal LAP. Calculated EF 58%. Increased LVOT velocity due to severe LVH. This is the likely cause of systolic murmur. Mild tricuspid regurgitation. Estimated pulmonary artery systolic pressure 25 mmHg.  Lexiscan myoview stress test 03/02/2018: 1. Lexiscan stress test was performed. Exercise capacity was not assessed. Stress symptoms included dizziness. Blood pressure was elevated throughout the study. The resting and stress electrocardiogram demonstrated normal sinus rhythm, inferolateral T wave inversions, normal resting conduction, and no resting  arrhythmias. Stress EKG is non diagnostic for ischemia as it is a pharmacologic stress. 2. The overall quality of the study is excellent. There is no evidence of abnormal lung activity. Stress and rest SPECT images demonstrate homogeneous tracer distribution throughout the myocardium. Gated SPECT imaging reveals normal myocardial thickening and wall motion. The left ventricular ejection fraction was normal (63%).  3. Low risk study.  EKG:  EKG 02/10/2020: Sinus bradycardia at rate of 57 bpm, normal axis, LVH with repolarization abnormality.  Cannot exclude inferior and lateral ischemia. No significant change from 06/10/2019.  Assessment     ICD-10-CM   1. Essential hypertension  I10 EKG 12-Lead    valsartan-hydrochlorothiazide (DIOVAN HCT) 160-12.5 MG tablet    Basic metabolic panel    amLODipine (NORVASC) 10 MG tablet    metoprolol succinate (TOPROL-XL) 100 MG 24 hr tablet  2. Obstructive hypertrophic cardiomyopathy (HCC)  I42.1 PCV ECHOCARDIOGRAM COMPLETE    metoprolol succinate (TOPROL-XL) 100 MG 24 hr tablet  3. Dyspnea on exertion  R06.00 PCV ECHOCARDIOGRAM COMPLETE  4. Obstructive sleep apnea of adult  G47.33 Ambulatory referral to Sleep Studies     Recommendations:   Meds ordered this encounter  Medications  . valsartan-hydrochlorothiazide (DIOVAN HCT) 160-12.5 MG tablet    Sig: Take 1 tablet by mouth daily.    Dispense:  30 tablet    Refill:  2  . amLODipine (NORVASC) 10 MG tablet    Sig: Take 1 tablet (10 mg total) by mouth daily.    Dispense:  90 tablet    Refill:  3  . metoprolol succinate (TOPROL-XL) 100 MG 24 hr tablet    Sig: Take 1 tablet (100 mg total) by mouth daily. Take with or immediately following a meal.    Dispense:  90 tablet    Refill:  3    Renee Kelly  is a 64 y.o. AAF who was referred to [place] by her PCP for evaluation of cardiac murmur and seen by [place] on  01/01/18. Has past medical history of hypertension that has been managed with metoprolol and  amlodipine 10 mg.  Echocardiogram 02/08/28 in revealed severe LVH with LVOT obstruction suggestive of HOCM versus severe hypertensive heart disease. Carotid duplex did not reveal any significant abnormality. Lexiscan nuclear stress test was non-ischemic. MRI in Apr 2021 confirmed HOCM.  I reviewed the results of the MRI with the patient, she has definite HOCM.  She needs genetic testing.  I suspect he probably has a wide strain for HOCM.  We will confirm this by having testing.  Blood pressure is uncontrolled, I will add valsartan HCT, will not use higher dose of HCTZ due to HOCM.  I will obtain BMP in 2 weeks.  She has severe symptoms of sleep apnea, was previously diagnosed with sleep apnea but never picked up her CPAP.  Hypertension this probably contributed by sleep apnea, I will refer her to be evaluated further to Dr. Rexene Alberts.  Obesity discussed with the patient, weight loss discussed with the patient.  I will see her back in 6 weeks for follow-up   Adrian Prows, MD, [facility] 02/10/2020, 11:03 AM Office: [phone]

## 2020-02-19 ENCOUNTER — Ambulatory Visit: Payer: Medicare HMO | Admitting: Cardiology

## 2020-02-20 ENCOUNTER — Ambulatory Visit: Payer: Medicare HMO | Admitting: Cardiology

## 2020-02-25 DIAGNOSIS — I10 Essential (primary) hypertension: Secondary | ICD-10-CM | POA: Diagnosis not present

## 2020-02-26 LAB — BASIC METABOLIC PANEL
BUN/Creatinine Ratio: 17 (ref 12–28)
BUN: 16 mg/dL (ref 8–27)
CO2: 26 mmol/L (ref 20–29)
Calcium: 10 mg/dL (ref 8.7–10.3)
Chloride: 98 mmol/L (ref 96–106)
Creatinine, Ser: 0.94 mg/dL (ref 0.57–1.00)
GFR calc Af Amer: 74 mL/min/{1.73_m2} (ref >59)
GFR calc non Af Amer: 64 mL/min/{1.73_m2} (ref >59)
Glucose: 115 mg/dL — ABNORMAL HIGH (ref 65–99)
Potassium: 4.3 mmol/L (ref 3.5–5.2)
Sodium: 138 mmol/L (ref 134–144)

## 2020-03-19 ENCOUNTER — Ambulatory Visit: Payer: Medicare HMO | Admitting: Neurology

## 2020-03-19 ENCOUNTER — Other Ambulatory Visit: Payer: Self-pay

## 2020-03-19 ENCOUNTER — Encounter: Payer: Self-pay | Admitting: Neurology

## 2020-03-19 VITALS — BP 126/84 | HR 50 | Ht 63.0 in | Wt 192.0 lb

## 2020-03-19 DIAGNOSIS — G4719 Other hypersomnia: Secondary | ICD-10-CM

## 2020-03-19 DIAGNOSIS — R519 Headache, unspecified: Secondary | ICD-10-CM | POA: Diagnosis not present

## 2020-03-19 DIAGNOSIS — R351 Nocturia: Secondary | ICD-10-CM | POA: Diagnosis not present

## 2020-03-19 DIAGNOSIS — I421 Obstructive hypertrophic cardiomyopathy: Secondary | ICD-10-CM

## 2020-03-19 DIAGNOSIS — E669 Obesity, unspecified: Secondary | ICD-10-CM | POA: Diagnosis not present

## 2020-03-19 DIAGNOSIS — G4733 Obstructive sleep apnea (adult) (pediatric): Secondary | ICD-10-CM | POA: Diagnosis not present

## 2020-03-19 DIAGNOSIS — Z820 Family history of epilepsy and other diseases of the nervous system: Secondary | ICD-10-CM

## 2020-03-19 NOTE — Patient Instructions (Signed)

## 2020-03-19 NOTE — Progress Notes (Signed)
Subjective:    Patient ID: Renee Kelly is a 64 y.o. female.  HPI     Star Age, MD, PhD [facility] [address] [place], [place] [address]  Dear Renee Kelly,   I saw your patient, [name], upon your kind request, in my Sleep clinic today for initial consultation of her sleep disorder, in particular, evaluation of her prior diagnosis of OSA. The patient is unaccompanied today. As you know, [name] is a 64 year old right-handed woman with an underlying medical history of reflux disease, headaches, anosmia (has seen Jaynee Eagles for this), cataracts, arthritis, allergies, hypertension, HOCM, ovarian cancer and obesity, who was previously diagnosed with obstructive sleep apnea. She is currently not on CPAP therapy. Prior sleep study results are not available for my review today, testing was over 5 years ago. I reviewed your office note from 02/10/2020. Her Epworth sleepiness score is 12/24, fatigue severity score is 46 out of 63.  She reports that her study was done in [place] about 7 years ago or so.  As far she recalls she was told she had severe sleep apnea.  She decided not to use CPAP therapy because of the noise of the machine.  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  Nevertheless, she would be willing to get reevaluated and consider CPAP therapy at this time.  Her son has sleep apnea and his machine is not loud.  Another son has a pacemaker placed.  She goes to bed around 11, rise time is around 11 or noon, she is typically awake by 7 or latest by 9 but lays in bed.  She has a TV in the bedroom but turns it off when she is ready to fall asleep.  She has no pets in the household, lives alone, she is divorced and has 2 grown sons.  She is retired.  She is a non-smoker and does not drink alcohol, does not drink caffeine on a daily basis.  She drinks herbal tea and water, occasional soda.  She has had  some teeth extractions and is seeking further evaluation for her dental problems.  She has nocturia about 2-3 times per average night and has woken up occasionally with a headache, not enough to take medication.   Her Past Medical History Is Significant For: Past Medical History:  Diagnosis Date  . Allergy   . Arthritis    all over per pt.  . Cataract   . GERD (gastroesophageal reflux disease)    past hx only   . Glaucoma   . Heart disease   . Heart murmur   . Hypertension   . Lactose intolerance   . Ovary cancer ([place])    ovarian cancer  . Sleep apnea    no cpap    Her Past Surgical History Is Significant For: Past Surgical History:  Procedure Laterality Date  . COLONOSCOPY     2 past colonoscopies- 1st 3 polyps, 2nd no polyps- said return 10 yrs   . NSVD     x 2     Her Family History Is Significant For: Family History  Problem Relation Age of Onset  . Alzheimer's disease Mother   . Liver disease Father   . Colon polyps Sister   . Hypertension Sister   . Hypertension Brother   . Hypertension Sister   . Hypertension Sister   . Colon cancer Neg Hx   .  Esophageal cancer Neg Hx   . Rectal cancer Neg Hx   . Stomach cancer Neg Hx     Her Social History Is Significant For: Social History   Socioeconomic History  . Marital status: Single    Spouse name: Not on file  . Number of children: 2  . Years of education: Not on file  . Highest education level: Not on file  Occupational History  . Not on file  Tobacco Use  . Smoking status: Never Smoker  . Smokeless tobacco: Never Used  Vaping Use  . Vaping Use: Never used  Substance and Sexual Activity  . Alcohol use: No  . Drug use: No  . Sexual activity: Not on file  Other Topics Concern  . Not on file  Social History Narrative   Lives at home alone   Right handed    Sometimes drinks tea   Social Determinants of Health   Financial Resource Strain:   . Difficulty of Paying Living Expenses: Not on file   Food Insecurity:   . Worried About Charity fundraiser in the Last Year: Not on file  . Ran Out of Food in the Last Year: Not on file  Transportation Needs:   . Lack of Transportation (Medical): Not on file  . Lack of Transportation (Non-Medical): Not on file  Physical Activity:   . Days of Exercise per Week: Not on file  . Minutes of Exercise per Session: Not on file  Stress:   . Feeling of Stress : Not on file  Social Connections:   . Frequency of Communication with Friends and Family: Not on file  . Frequency of Social Gatherings with Friends and Family: Not on file  . Attends Religious Services: Not on file  . Active Member of Clubs or Organizations: Not on file  . Attends Archivist Meetings: Not on file  . Marital Status: Not on file    Her Allergies Are:  Allergies  Allergen Reactions  . Avocado     Nausea/Vomiting   :   Her Current Medications Are:  Outpatient Encounter Medications as of 03/19/2020  Medication Sig  . amLODipine (NORVASC) 10 MG tablet Take 1 tablet (10 mg total) by mouth daily.  . Cholecalciferol (VITAMIN D3) 1000 units CAPS Take by mouth.  . Cyanocobalamin (VITAMIN B 12 PO) Take 2 tablets by mouth daily.  Marland Kitchen LUMIGAN 0.01 % SOLN INSTILL 1 DROP INTO EACH EYE AT BEDTIME  . metoprolol succinate (TOPROL-XL) 100 MG 24 hr tablet Take 1 tablet (100 mg total) by mouth daily. Take with or immediately following a meal.  . Omega-3 Fatty Acids (FISH OIL) 1000 MG CAPS Take by mouth.  . valsartan-hydrochlorothiazide (DIOVAN HCT) 160-12.5 MG tablet Take 1 tablet by mouth daily.   No facility-administered encounter medications on file as of 03/19/2020.  :  Review of Systems:  Out of a complete 14 point review of systems, all are reviewed and negative with the exception of these symptoms as listed below:      Review of Systems  Neurological:       Here for sleep consult. Prior sleep study 5+ years ago OSA was found and cpap was recommended. Pt sts she  could not use the machine due to the noise. Pt reports she does snore at night.  Epworth Sleepiness Scale 0= would never doze 1= slight chance of dozing 2= moderate chance of dozing 3= high chance of dozing  Sitting and reading: Watching TV: Sitting inactive  in a public place (ex. Theater or meeting): As a passenger in a car for an hour without a break: Lying down to rest in the afternoon: Sitting and talking to someone: Sitting quietly after lunch (no alcohol): In a car, while stopped in traffic: Total:     Objective:  Neurological Exam  Physical Exam Physical Examination:   Vitals:   03/19/20 1117  BP: 126/84  Pulse: (!) 50  SpO2: 98%    General Examination: The patient is a very pleasant 64 y.o. female in no acute distress. She appears well-developed and well-nourished and well groomed.   HEENT: Normocephalic, atraumatic, pupils are equal, round and reactive to light, extraocular tracking is good without limitation to gaze excursion or nystagmus noted. Hearing is grossly intact. Face is symmetric with normal facial animation. Speech is clear with no dysarthria noted. There is no hypophonia. There is no lip, neck/head, jaw or voice tremor. Neck is supple with full range of passive and active motion. There are no carotid bruits on auscultation. Oropharynx exam reveals: mild mouth dryness, adequate dental hygiene, several missing teeth, moderate airway crowding secondary to tonsillar size of about 3+, larger uvula, slightly wider tongue, tongue protrudes centrally in palate elevates symmetrically.  Mallampati class I, minimal overbite noted, neck circumference of 16-1/4 inches.    Chest: Clear to auscultation without wheezing, rhonchi or crackles noted.  Heart: S1+S2+0, regular with 2/6 systolic murmur noted.   Abdomen: Soft, non-tender and non-distended with normal bowel sounds appreciated on auscultation.  Extremities: There is non-pitting puffiness noted in the right  ankle and foot.    Skin: Warm and dry without trophic changes noted.   Musculoskeletal: exam reveals right knee pain.     Neurologically:  Mental status: The patient is awake, alert and oriented in all 4 spheres. Her immediate and remote memory, attention, language skills and fund of knowledge are appropriate. There is no evidence of aphasia, agnosia, apraxia or anomia. Speech is clear with normal prosody and enunciation. Thought process is linear. Mood is normal and affect is normal.  Cranial nerves II - XII are as described above under HEENT exam.  Motor exam: Normal bulk, strength and tone is noted. There is no tremor, Romberg is negative. Fine motor skills and coordination: grossly intact.  Cerebellar testing: No dysmetria or intention tremor. There is no truncal or gait ataxia.  Sensory exam: intact to light touch in the upper and lower extremities.  Gait, station and balance: She stands easily. No veering to one side is noted. No leaning to one side is noted. Posture is age-appropriate and stance is narrow based. Gait shows normal stride length and normal pace. No problems turning are noted. Tandem walk is difficult for her.   Assessment and Plan:  In summary, Danette Weinfeld is a very pleasant 64 y.o.-year old female with an underlying medical history of reflux disease, headaches, anosmia (has seen Jaynee Eagles for this), cataracts, arthritis, allergies, hypertension, HOCM, ovarian cancer and obesity, who presents for evaluation of her sleep disorder, in particular, her prior diagnosis of OSA.  She has not been on CPAP therapy.  [place].  I had a long chat with the patient about my findings and the diagnosis of OSA, its prognosis and treatment options. We talked about medical treatments, surgical interventions and non-pharmacological approaches. I explained in particular the risks and ramifications of untreated moderate to severe OSA, especially with  respect to developing cardiovascular disease down the Road, including  congestive heart failure, difficult to treat hypertension, cardiac arrhythmias, or stroke. Even type 2 diabetes has, in part, been linked to untreated OSA. Symptoms of untreated OSA include daytime sleepiness, memory problems, mood irritability and mood disorder such as depression and anxiety, lack of energy, as well as recurrent headaches, especially morning headaches. We talked about trying to maintain a healthy lifestyle in general, as well as the importance of weight control. We also talked about the importance of good sleep hygiene. I recommended the following at this time: sleep study.  I explained the sleep test procedure to the patient and also outlined possible surgical and non-surgical treatment options of OSA, including the use of a custom-made dental device (which would require a referral to a specialist dentist or oral surgeon), upper airway surgical options, such as traditional UPPP or a novel less invasive surgical option in the form of Inspire hypoglossal nerve stimulation (which would involve a referral to an ENT surgeon). I also explained the CPAP treatment option to the patient, who indicated that she would be willing to try CPAP if the need arises. I explained the importance of being compliant with PAP treatment, not only for insurance purposes but primarily to improve Her symptoms, and for the patient's long term health benefit, including to reduce Her cardiovascular risks. I answered all her questions today and the patient was in agreement. I plan to see her back after the sleep study is completed and encouraged her to call with any interim questions, concerns, problems or updates.   Thank you very much for allowing me to participate in the care of this nice patient. If I can be of any further assistance to you please do not hesitate to call me at [phone].  Sincerely,   Star Age, MD, PhD

## 2020-03-30 ENCOUNTER — Encounter: Payer: Self-pay | Admitting: Cardiology

## 2020-03-30 ENCOUNTER — Ambulatory Visit: Payer: Medicare HMO | Admitting: Cardiology

## 2020-03-30 ENCOUNTER — Other Ambulatory Visit: Payer: Self-pay

## 2020-03-30 VITALS — BP 142/80 | HR 65 | Resp 16 | Ht 63.0 in | Wt 193.0 lb

## 2020-03-30 DIAGNOSIS — I421 Obstructive hypertrophic cardiomyopathy: Secondary | ICD-10-CM | POA: Diagnosis not present

## 2020-03-30 DIAGNOSIS — I10 Essential (primary) hypertension: Secondary | ICD-10-CM | POA: Diagnosis not present

## 2020-03-30 MED ORDER — VALSARTAN-HYDROCHLOROTHIAZIDE 160-12.5 MG PO TABS: 1.0000 | ORAL_TABLET | Freq: Every day | ORAL | 3 refills | Status: DC

## 2020-03-30 NOTE — Progress Notes (Signed)
Primary Physician/Referring:  Bartholome Bill, MD  Patient ID: Renee Kelly, female    DOB: [birth year], 64 y.o.   MRN: [id number]  Chief Complaint  Patient presents with  . Hypertension  . Cardiomyopathy  . Follow-up    6 month   HPI:    Renee Kelly  is a 64 y.o.  AAF who was referred to [place] by her PCP for evaluation of cardiac murmur and seen by [place] on 01/01/18. Has past medical history of hypertension that has been managed with metoprolol and amlodipine 10 mg.  Echocardiogram 02/08/28 in revealed severe LVH with LVOT obstruction suggestive of HOCM versus severe hypertensive heart disease.Carotid duplex did not reveal any significant abnormality. Lexiscan nuclear stress test was non-ischemic. MRI in Apr 2021 confirmed HOCM.  She is orginally from [place] and [place]. Presents for 6 week follow up. She reports chronic dyspnea on exertion, but denies orthopnea, PND, leg swelling, dizziness, syncope. She is scheduled for a sleep study October 6th.  Past Medical History:  Diagnosis Date  . Allergy   . Arthritis    all over per pt.  . Cataract   . GERD (gastroesophageal reflux disease)    past hx only   . Glaucoma   . Heart disease   . Heart murmur   . Hypertension   . Lactose intolerance   . Ovary cancer ([place])    ovarian cancer  . Sleep apnea    no cpap   Past Surgical History:  Procedure Laterality Date  . COLONOSCOPY     2 past colonoscopies- 1st 3 polyps, 2nd no polyps- said return 10 yrs   . NSVD     x 2    Social History   Tobacco Use  . Smoking status: Never Smoker  . Smokeless tobacco: Never Used  Substance Use Topics  . Alcohol use: No   Marital Status: Single   ROS  Review of Systems  Cardiovascular: Positive for dyspnea on exertion. Negative for chest pain and leg swelling.  Gastrointestinal: Negative for melena.   Objective   Vitals with BMI 03/30/2020 03/19/2020 02/10/2020  Height 5\' 3"  5\' 3"  5\' 4"   Weight 193 lbs 192 lbs 195  lbs  BMI 34.2 16.38 46.65  Systolic 993 570 177  Diastolic 80 84 87  Pulse 65 50 69      Physical Exam Constitutional:      General: She is not in acute distress.    Appearance: She is well-developed. She is obese.  Eyes:     Conjunctiva/sclera: Conjunctivae normal.  Neck:     Thyroid: No thyromegaly.  Cardiovascular:     Rate and Rhythm: Normal rate and regular rhythm.     Pulses: Normal pulses and intact distal pulses.     Heart sounds: S1 normal and S2 normal. Murmur heard.  Harsh mid to late systolic murmur is present with a grade of 2/6 at the upper right sternal border and apex.  No gallop.      Comments: No leg edema, no JVD.  Pulmonary:     Effort: Pulmonary effort is normal.     Breath sounds: Normal breath sounds.  Abdominal:     General: Bowel sounds are normal.     Palpations: Abdomen is soft.    Laboratory examination:   External labs:  BMP Latest Ref Rng & Units 02/25/2020 09/05/2019 05/09/2017  Glucose 65 - 99 mg/dL 115(H) 172(H) 105(H)  BUN 8 - 27 mg/dL 16  10 16  Creatinine 0.57 - 1.00 mg/dL 0.94 0.94 0.88  BUN/Creat Ratio 12 - 28 17 - 18  Sodium 134 - 144 mmol/L 138 137 143  Potassium 3.5 - 5.2 mmol/L 4.3 3.8 4.3  Chloride 96 - 106 mmol/L 98 103 104  CO2 20 - 29 mmol/L 26 25 31(H)  Calcium 8.7 - 10.3 mg/dL 10.0 9.7 9.7    CBC Latest Ref Rng & Units 09/05/2019 05/09/2017 11/02/2016  WBC 4.0 - 10.5 K/uL 8.1 6.5 7.9  Hemoglobin 12.0 - 15.0 g/dL 13.8 13.4 13.5  Hematocrit 36 - 46 % 42.5 41.9 41.4  Platelets 150 - 400 K/uL 285 233 248    Lipid ProfileResulted: 03/25/2019 6:50 PM [facility] Component Name Value Ref Range  Total Cholesterol 183 25 - 199 MG/DL  Triglycerides 100 10 - 150 MG/DL  HDL Cholesterol 56 35 - 135 MG/DL  LDL Cholesterol 107    Hemoglobin A1CResulted: 03/28/2018 6:59 PM [facility] Component Name Value Ref Range  HEMOGLOBIN A1C 5.8 (H)  Comment:  Normal:    Less than  5.7% Prediabetes: 5.7% to 6.4% Diabetes:   Greater than 6.4% <5.7 %  eAG 120 MG/DL   Medications and allergies   Allergies  Allergen Reactions  . Avocado     Nausea/Vomiting     Current Outpatient Medications on File Prior to Visit  Medication Sig Dispense Refill  . amLODipine (NORVASC) 10 MG tablet Take 1 tablet (10 mg total) by mouth daily. 90 tablet 3  . Cholecalciferol (VITAMIN D3) 1000 units CAPS Take by mouth.    . Cyanocobalamin (VITAMIN B 12 PO) Take 2 tablets by mouth daily.    Marland Kitchen LUMIGAN 0.01 % SOLN INSTILL 1 DROP INTO EACH EYE AT BEDTIME    . metoprolol succinate (TOPROL-XL) 100 MG 24 hr tablet Take 1 tablet (100 mg total) by mouth daily. Take with or immediately following a meal. 90 tablet 3  . Omega-3 Fatty Acids (FISH OIL) 1000 MG CAPS Take by mouth.     No current facility-administered medications on file prior to visit.   Radiology:   Cardiac MR 10/30/2019: 1. Normal left ventricular size, with severe basal septal hypertrophy and moderate hypertrophy of the residual myocardium and hyperdynamic systolic function (LVEF = 74%) with gradient across the LVOT. There is a small apical aneurysm slightly enlarging in systole. No late gadolinium enhancement is seen in the left ventricular myocardium. 2. Normal right ventricular size, thickness and systolic function (LVEF = 66%). There are no regional wall motion abnormalities. 3. Moderate to severe left atrial dilatation. 4. Normal size of the aortic root, ascending aorta and pulmonary artery. 5. Mild to moderate mitral and trivial tricuspid regurgitation. 6. Normal pericardium. Trivial pericardial effusion.  These findings are consistent with a typical form of hypertrophic cardiomyopathy with no late gadolinium enhancement and a small apical aneurysm.  Cardiac Studies:   Carotid artery duplex March 02, 2018: Minimal stenosis in bilateral internal carotid artery (1-15%). Antegrade right vertebral artery flow. Antegrade left  vertebral artery flow.  Echocardiogram 2018-03-02: Left ventricle cavity is normal in size. Severe concentric hypertrophy of the left ventricle, both septal and posterior wall measuring 1.8 cm in AP dimension. Normal global wall motion. Doppler evidence of grade I (impaired) diastolic dysfunction, normal LAP. Calculated EF 58%. Increased LVOT velocity due to severe LVH. This is the likely cause of systolic murmur. Mild tricuspid regurgitation. Estimated pulmonary artery systolic pressure 25 mmHg.  Lexiscan myoview stress test 03/02/2018: 1.  Lexiscan stress test was performed. Exercise capacity was not assessed. Stress symptoms included dizziness. Blood pressure was elevated throughout the study. The resting and stress electrocardiogram demonstrated normal sinus rhythm, inferolateral T wave inversions, normal resting conduction, and no resting arrhythmias. Stress EKG is non diagnostic for ischemia as it is a pharmacologic stress. 2. The overall quality of the study is excellent. There is no evidence of abnormal lung activity. Stress and rest SPECT images demonstrate homogeneous tracer distribution throughout the myocardium. Gated SPECT imaging reveals normal myocardial thickening and wall motion. The left ventricular ejection fraction was normal (63%).  3. Low risk study.  EKG: EKG 02/10/2020: Sinus bradycardia at rate of 57 bpm, normal axis, LVH with repolarization abnormality.  Cannot exclude inferior and lateral ischemia. No significant change from 06/10/2019.  Assessment     ICD-10-CM   1. Obstructive hypertrophic cardiomyopathy (HCC)  I42.1   2. Essential hypertension  I10 valsartan-hydrochlorothiazide (DIOVAN HCT) 160-12.5 MG tablet     Medications Discontinued During This Encounter  Medication Reason  . valsartan-hydrochlorothiazide (DIOVAN HCT) 160-12.5 MG tablet Reorder   Meds ordered this encounter  Medications  . valsartan-hydrochlorothiazide (DIOVAN HCT) 160-12.5 MG tablet     Sig: Take 1 tablet by mouth daily.    Dispense:  90 tablet    Refill:  3    Recommendations:   Renee Kelly  is a 64 y.o. AAF who was referred to [place] by her PCP for evaluation of cardiac murmur and seen by [place] on 01/01/18. Has past medical history of hypertension that has been managed with metoprolol and amlodipine 10 mg.  Echocardiogram 02/08/28 in revealed severe LVH with LVOT obstruction suggestive of HOCM versus severe hypertensive heart disease.Carotid duplex did not reveal any significant abnormality. Lexiscan nuclear stress test was non-ischemic. MRI in Apr 2021 confirmed HOCM.  The patient presents for 6 week follow up. Her blood pressure is uncontrolled. After discussion with the patient it seems she has stopped taking valsartan HCT about 2 weeks ago due to some confusion regarding refills. However, she states she tolerated this medication well with the exception of it causing some pimples on her face. We will restart this at 160-12.5 mg.   The patient was referred to Dr. Rexene Alberts regarding sleep apnea at her last visit as she has been diagnosed with this in the past and is having symptoms currently. She was seen in their office and is now scheduled for a home sleep study October 6th. Untreated sleep apnea may be contributing to her uncontrolled hypertension.   I again discussed obesity with the patient and encouraged weight loss. Discussed healthy diet and increasing physical activity. Buccal swab for Genetic testing for HOCM was performed today, will follow up on those results and discuss at next visit. She has not had the echocardiogram which was ordered at her last visit. We will schedule this today as well. I will see the patient back in 3 months for follow up of hypertension, HOCM.   Blair Heys, PA Student 03/30/20 12:48 PM   Patient seen and examined in conjunction with Blair Heys, PA second year student at [employer].  Time spent is in direct patient face to face encounter not  including the teaching and training involved.    Adrian Prows, MD, [facility] 03/30/2020, 12:50 PM Office: [phone]

## 2020-04-07 ENCOUNTER — Ambulatory Visit: Payer: Medicare HMO

## 2020-04-07 ENCOUNTER — Other Ambulatory Visit: Payer: Self-pay

## 2020-04-07 DIAGNOSIS — I421 Obstructive hypertrophic cardiomyopathy: Secondary | ICD-10-CM

## 2020-04-07 DIAGNOSIS — R0600 Dyspnea, unspecified: Secondary | ICD-10-CM

## 2020-04-07 DIAGNOSIS — R0609 Other forms of dyspnea: Secondary | ICD-10-CM | POA: Diagnosis not present

## 2020-04-13 ENCOUNTER — Telehealth: Payer: Self-pay | Admitting: Student

## 2020-04-13 NOTE — Telephone Encounter (Signed)
Discussed with patient results of genetic testing (HCM next analysis of 30 genes associated with hypertrophic cardiomyopathy) results 04/13/2020.  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  Discussed this with patient, she expressed understanding.  Also informed patient of services offered by [facility] including complementary genetic counseling and complementary family studies program.  At this time she does not wish to proceed with genetic counseling.  She is going to talk to family members about the family studies program.  She will let our office know if she would like more information regarding this program.

## 2020-04-13 NOTE — Progress Notes (Signed)
Relayed information to patient. Patient voiced understanding.  

## 2020-04-15 ENCOUNTER — Other Ambulatory Visit: Payer: Self-pay

## 2020-04-15 ENCOUNTER — Ambulatory Visit (INDEPENDENT_AMBULATORY_CARE_PROVIDER_SITE_OTHER): Payer: Medicare HMO | Admitting: Neurology

## 2020-04-15 DIAGNOSIS — E669 Obesity, unspecified: Secondary | ICD-10-CM

## 2020-04-15 DIAGNOSIS — G4719 Other hypersomnia: Secondary | ICD-10-CM

## 2020-04-15 DIAGNOSIS — R519 Headache, unspecified: Secondary | ICD-10-CM

## 2020-04-15 DIAGNOSIS — I421 Obstructive hypertrophic cardiomyopathy: Secondary | ICD-10-CM

## 2020-04-15 DIAGNOSIS — Z820 Family history of epilepsy and other diseases of the nervous system: Secondary | ICD-10-CM

## 2020-04-15 DIAGNOSIS — G4733 Obstructive sleep apnea (adult) (pediatric): Secondary | ICD-10-CM

## 2020-04-15 DIAGNOSIS — R351 Nocturia: Secondary | ICD-10-CM

## 2020-04-16 NOTE — Procedures (Signed)
Sleep Study Report   Patient Information     First Name: Annalynn Last Name: Petrea ID: [id number]  Birth Date: 2055-11-15 Age: 64 Gender: Female  Referring Provider: Dola Factor, MD BMI: 34.0 (W=192 lb, H=5' 3'')  Neck Circ.:  62 '' Epworth:   Sleep Study Information    Study Date: 04/15/20 S/H/A Version: [id number] / 4.2.1023 / 84  History:    64 year old woman with a history of reflux disease, headaches, anosmia, cataracts, arthritis, allergies, hypertension, HOCM, ovarian cancer and obesity, who was previously diagnosed with obstructive sleep apnea. She is currently not on CPAP therapy.  Summary & Diagnosis:     OSA Recommendations:     This home sleep test demonstrates severe obstructive sleep apnea with a total AHI of 42/hour and O2 nadir of 83%. The study was limited due to reduced total testing time of 2 hours and 55 minutes and estimated total sleep time of 2 hours and 32 minutes. However, patient carries a prior diagnosis of severe sleep apnea. Treatment with positive airway pressure is recommended. The patient will be advised to proceed with an autoPAP titration/trial at home for now.  A full night titration study can be considered for optimization of treatment settings, if needed down the road. Alternative treatment options will be limited secondary to the severity of her sleep disordered breathing.  Concomitant weight loss is recommended. Please note, that untreated obstructive sleep apnea may carry additional perioperative morbidity. Patients with significant obstructive sleep apnea should receive perioperative PAP therapy and the surgeons and particularly the anesthesiologist should be informed of the diagnosis and the severity of the sleep disordered breathing. The patient should be cautioned not to drive, work at heights, or operate dangerous or heavy equipment when tired or sleepy. Review and reiteration of good sleep hygiene measures should be pursued with any patient. Other causes  of the patient's symptoms, including circadian rhythm disturbances, an underlying mood disorder, medication effect and/or an underlying medical problem cannot be ruled out based on this test. Clinical correlation is recommended. The patient and her referring provider will be notified of the test results. The patient will be seen in follow up in sleep clinic at [facility].  I certify that I have reviewed the raw data recording prior to the issuance of this report in accordance with the standards of the American Academy of Sleep Medicine (AASM).  Star Age, MD, PhD [facility]) Diplomat, ABPN (Neurology and Sleep)        Sleep Summary  Oxygen Saturation Statistics   Start Study Time: End Study Time: Total Recording Time:          11:13:52 PM 2:09:20 AM            2 h, 55 min  Total Sleep Time % REM of Sleep Time:  2 h, 32 min  22.0    Mean: 95 Minimum: 83 Maximum: 97  Mean of Desaturations Nadirs (%):   90  Oxygen Desaturation. %: 4-9 10-20 >20 Total  Events Number Total  20 100.0  0 0.0  0 0.0  20 100.0  Oxygen Saturation: <90 <=88 <85 <80 <70  Duration (minutes): Sleep % 2.4 1.1 1.6 0.7 0.2 0.1 0.0 0.0 0.0 0.0     Respiratory Indices      Total Events REM NREM All Night  pRDI: pAHI 3%: ODI 4%: pAHIc 3%: % CSR: pAHI 4%:  104  100  20  0 0.0 26 48.4 48.4 18.2 0.0 42.4 40.3 5.8 0.0  43.7 42.0 8.4 0.0 10.9       Pulse Rate Statistics during Sleep (BPM)      Mean: 45 Minimum: 36 Maximum: 67    Indices are calculated using technically valid sleep time of  2 h, 22 min.                                                                 pAHI=42.0                                                                            Mild              Moderate                    Severe                                                 5              15                    30                  Wake 1:43    0    1:13    0    0:43    0    0:13    0    3     23:4    3     23:1     REM  L Sleep  D Sleep Body Position Statistics  Position Supine Prone Right Left Non-Supine  Sleep (min) 133.0 0.0 0.0 19.5 19.5  Sleep % 87.2 0.0 0.0 12.8 12.8  pRDI 41.1 N/A N/A 60.0 60.0  pAHI 3% 39.2 N/A N/A 60.0 60.0  ODI 4% 5.3 N/A N/A 28.4 28.4         Left   Supine    Snoring Statistics Snoring Level (dB) >40 >50 >60 >70 >80 >Threshold (45)  Sleep (min) 56.2 0.7 0.2 0.0 0.0 1.5  Sleep % 36.9 0.5 0.1 0.0 0.0 1.0    Mean: 41 dB Sleep Stages Chart                         Wake  Sleep      Wake  13.11  %    Sleep  86.89  %   Total:  100.00  %                                                       REM  Light  Deep      REM  21.97  %  Light  68.51  %    Deep  9.52  %   Total:  100.00  %                                 Sleep/Wake States  Sleep Stages  Sleep Latency (min):  REM Latency (min):  Number of Wakes:   6   86   2

## 2020-04-16 NOTE — Progress Notes (Signed)
Patient referred by Dr. Einar Gip for reevaluation of her prior diagnosis of sleep apnea.  She no longer uses CPAP therapy.  I saw her on 03/19/2020 and she had a home sleep test on 04/15/2020.    Please call and notify the patient that the recent home sleep test showed obstructive sleep apnea in the severe range.  I recommend she start treatment with an autoPAP machine at home, through a DME company (of her choice, or as per insurance requirement). The DME representative will educate her on how to use the machine, how to put the mask on, etc. I have placed an order in the chart. Please send referral, talk to patient, send report to referring MD. We will need a FU in sleep clinic for 10 weeks post-PAP set up, please arrange that with me or one of our NPs. Thanks,   Star Age, MD, PhD [facility])

## 2020-04-16 NOTE — Addendum Note (Signed)
Addended by: Star Age on: 04/16/2020 06:25 PM   Modules accepted: Orders

## 2020-04-20 DIAGNOSIS — H401122 Primary open-angle glaucoma, left eye, moderate stage: Secondary | ICD-10-CM | POA: Diagnosis not present

## 2020-04-20 DIAGNOSIS — H401111 Primary open-angle glaucoma, right eye, mild stage: Secondary | ICD-10-CM | POA: Diagnosis not present

## 2020-04-21 ENCOUNTER — Telehealth: Payer: Self-pay

## 2020-04-21 NOTE — Telephone Encounter (Signed)
I called pt. I advised pt that Dr. Rexene Alberts reviewed their sleep study results and found that pt has severe osa. Dr. Rexene Alberts recommends that pt start an auto pap. I reviewed PAP compliance expectations with the pt. Pt is agreeable to starting an auto-PAP. I advised pt that an order will be sent to a DME, [facility], and [facility] will call the pt within about one week after they file with the pt's insurance. [facility] will show the pt how to use the machine, fit for masks, and troubleshoot the auto-PAP if needed. A follow up appt was made for insurance purposes with Amy, NP on 07/30/2020 at 10:30am. Pt verbalized understanding to arrive 15 minutes early and bring their auto-PAP. A letter with all of this information in it will be mailed to the pt as a reminder. I verified with the pt that the address we have on file is correct. Pt verbalized understanding of results. Pt had no questions at this time but was encouraged to call back if questions arise. I have sent the order to [facility] and have received confirmation that they have received the order.

## 2020-04-21 NOTE — Telephone Encounter (Signed)
-----   Message from Star Age, MD sent at 04/16/2020  6:25 PM EDT ----- Patient referred by Dr. Einar Gip for reevaluation of her prior diagnosis of sleep apnea.  She no longer uses CPAP therapy.  I saw her on 03/19/2020 and she had a home sleep test on 04/15/2020.    Please call and notify the patient that the recent home sleep test showed obstructive sleep apnea in the severe range.  I recommend she start treatment with an autoPAP machine at home, through a DME company (of her choice, or as per insurance requirement). The DME representative will educate her on how to use the machine, how to put the mask on, etc. I have placed an order in the chart. Please send referral, talk to patient, send report to referring MD. We will need a FU in sleep clinic for 10 weeks post-PAP set up, please arrange that with me or one of our NPs. Thanks,   Star Age, MD, PhD [facility])

## 2020-04-22 NOTE — Progress Notes (Signed)
Please thank Dr. Rexene Alberts for me

## 2020-07-09 ENCOUNTER — Ambulatory Visit: Payer: Medicare HMO | Admitting: Cardiology

## 2020-07-20 ENCOUNTER — Other Ambulatory Visit: Payer: Self-pay

## 2020-07-20 ENCOUNTER — Ambulatory Visit: Payer: Medicare HMO | Admitting: Cardiology

## 2020-07-20 ENCOUNTER — Encounter: Payer: Self-pay | Admitting: Cardiology

## 2020-07-20 VITALS — BP 126/69 | HR 62 | Resp 16 | Ht 63.0 in | Wt 185.4 lb

## 2020-07-20 DIAGNOSIS — I5189 Other ill-defined heart diseases: Secondary | ICD-10-CM

## 2020-07-20 DIAGNOSIS — R0600 Dyspnea, unspecified: Secondary | ICD-10-CM

## 2020-07-20 DIAGNOSIS — I10 Essential (primary) hypertension: Secondary | ICD-10-CM

## 2020-07-20 DIAGNOSIS — I421 Obstructive hypertrophic cardiomyopathy: Secondary | ICD-10-CM

## 2020-07-20 DIAGNOSIS — R0609 Other forms of dyspnea: Secondary | ICD-10-CM

## 2020-07-20 DIAGNOSIS — I119 Hypertensive heart disease without heart failure: Secondary | ICD-10-CM

## 2020-07-20 DIAGNOSIS — G4733 Obstructive sleep apnea (adult) (pediatric): Secondary | ICD-10-CM

## 2020-07-20 MED ORDER — VALSARTAN-HYDROCHLOROTHIAZIDE 160-12.5 MG PO TABS: 1.0000 | ORAL_TABLET | Freq: Every day | ORAL | 3 refills | Status: DC

## 2020-07-20 MED ORDER — METOPROLOL SUCCINATE ER 100 MG PO TB24: 100.0000 mg | ORAL_TABLET | Freq: Every day | ORAL | 3 refills | Status: DC

## 2020-07-20 MED ORDER — AMLODIPINE BESYLATE 10 MG PO TABS: 10.0000 mg | ORAL_TABLET | Freq: Every day | ORAL | 3 refills | Status: DC

## 2020-07-20 NOTE — Progress Notes (Signed)
Primary Physician/Referring:  Bartholome Bill, MD  Patient ID: Renee Kelly, female    DOB: 27-Feb-1956, 65 y.o.   MRN: [id number]  Chief Complaint  Patient presents with  . Hypertension  . Follow-up    3 month  . Results   HPI:    Renee Kelly  is a 65 y.o.  AAF who was referred to [place] by her PCP for evaluation of cardiac murmur and seen by [place] on 01/01/18. Has past medical history of hypertension that has been managed with metoprolol and amlodipine 10 mg.  Echocardiogram 02/08/28 in revealed severe LVH with LVOT obstruction suggestive of HOCM versus severe hypertensive heart disease.Carotid duplex did not reveal any significant abnormality. Lexiscan nuclear stress test was non-ischemic. MRI in Apr 2021 confirmed HOCM.  She is orginally from [place] and moved [place]. Presents for 6 week follow up. She reports chronic dyspnea on exertion, but denies orthopnea, PND, leg swelling, dizziness, syncope. She has OSA diagnosed in Oct 2021 and is awaiting CPAP. Has been loosing weight intentionally. Otherwise except for minimal dyspnea on exertion, no new symptoms.   Past Medical History:  Diagnosis Date  . Allergy   . Arthritis    all over per pt.  . Cataract   . GERD (gastroesophageal reflux disease)    past hx only   . Glaucoma   . Heart disease   . Heart murmur   . Hypertension   . Lactose intolerance   . Ovary cancer ([place])    ovarian cancer  . Sleep apnea    no cpap   Past Surgical History:  Procedure Laterality Date  . COLONOSCOPY     2 past colonoscopies- 1st 3 polyps, 2nd no polyps- said return 10 yrs   . NSVD     x 2    Social History   Tobacco Use  . Smoking status: Never Smoker  . Smokeless tobacco: Never Used  Substance Use Topics  . Alcohol use: No   Marital Status: Single   ROS  Review of Systems  Cardiovascular: Positive for dyspnea on exertion. Negative for chest pain and leg swelling.  Gastrointestinal: Negative for melena.    Objective   Vitals with BMI 07/20/2020 07/20/2020 03/30/2020  Height - 5\' 3"  5\' 3"   Weight - 185 lbs 6 oz 193 lbs  BMI - [id number]  Systolic [id number]  Diastolic 69 75 80  Pulse 62 43 65      Physical Exam Constitutional:      General: She is not in acute distress.    Appearance: She is well-developed. She is obese.  Eyes:     Conjunctiva/sclera: Conjunctivae normal.  Neck:     Thyroid: No thyromegaly.  Cardiovascular:     Rate and Rhythm: Normal rate and regular rhythm.     Pulses: Normal pulses and intact distal pulses.     Heart sounds: S1 normal and S2 normal. Murmur heard.   Harsh mid to late systolic murmur is present with a grade of 2/6 at the upper right sternal border and apex. No gallop.      Comments: No leg edema, no JVD.  Pulmonary:     Effort: Pulmonary effort is normal.     Breath sounds: Normal breath sounds.  Abdominal:     General: Bowel sounds are normal.     Palpations: Abdomen is soft.    Laboratory examination:   External labs:  BMP Latest Ref Rng & Units 02/25/2020  09/05/2019 05/09/2017  Glucose 65 - 99 mg/dL 115(H) 172(H) 105(H)  BUN 8 - 27 mg/dL 16 10 16   Creatinine 0.57 - 1.00 mg/dL 0.94 0.94 0.88  BUN/Creat Ratio 12 - 28 17 - 18  Sodium 134 - 144 mmol/L 138 137 143  Potassium 3.5 - 5.2 mmol/L 4.3 3.8 4.3  Chloride 96 - 106 mmol/L 98 103 104  CO2 20 - 29 mmol/L 26 25 31(H)  Calcium 8.7 - 10.3 mg/dL 10.0 9.7 9.7    CBC Latest Ref Rng & Units 09/05/2019 05/09/2017 11/02/2016  WBC 4.0 - 10.5 K/uL 8.1 6.5 7.9  Hemoglobin 12.0 - 15.0 g/dL 13.8 13.4 13.5  Hematocrit 36.0 - 46.0 % 42.5 41.9 41.4  Platelets 150 - 400 K/uL 285 233 248    Lipid ProfileResulted: 03/25/2019 6:50 PM [facility] Component Name Value Ref Range  Total Cholesterol 183 25 - 199 MG/DL  Triglycerides 100 10 - 150 MG/DL  HDL Cholesterol 56 35 - 135 MG/DL  LDL Cholesterol 107    Hemoglobin A1CResulted: 03/28/2018 6:59 PM [facility] Component Name Value Ref Range  HEMOGLOBIN A1C 5.8 (H)  Comment:  Normal:    Less than 5.7% Prediabetes: 5.7% to 6.4% Diabetes:   Greater than 6.4% <5.7 %  eAG 120 MG/DL   Medications and allergies   Allergies  Allergen Reactions  . Avocado     Nausea/Vomiting     Current Outpatient Medications on File Prior to Visit  Medication Sig Dispense Refill  . Cholecalciferol (VITAMIN D3) 1000 units CAPS Take by mouth.    . Cyanocobalamin (VITAMIN B 12 PO) Take 2 tablets by mouth daily.    Marland Kitchen LUMIGAN 0.01 % SOLN INSTILL 1 DROP INTO EACH EYE AT BEDTIME    . Omega-3 Fatty Acids (FISH OIL) 1000 MG CAPS Take by mouth.     No current facility-administered medications on file prior to visit.   Radiology:   Cardiac MR 10/30/2019: 1. Normal left ventricular size, with severe basal septal hypertrophy and moderate hypertrophy of the residual myocardium and hyperdynamic systolic function (LVEF = 74%) with gradient across the LVOT. There is a small apical aneurysm slightly enlarging in systole. No late gadolinium enhancement is seen in the left ventricular myocardium. 2. Normal right ventricular size, thickness and systolic function (LVEF = 66%). There are no regional wall motion abnormalities. 3. Moderate to severe left atrial dilatation. 4. Normal size of the aortic root, ascending aorta and pulmonary artery. 5. Mild to moderate mitral and trivial tricuspid regurgitation. 6. Normal pericardium. Trivial pericardial effusion.  These findings are consistent with a typical form of hypertrophic cardiomyopathy with no late gadolinium enhancement and a small apical aneurysm.  Cardiac Studies:   Carotid artery duplex 2018/03/06: Minimal stenosis in bilateral internal carotid artery (1-15%). Antegrade right vertebral artery flow. Antegrade left vertebral artery flow.  Lexiscan myoview stress test 03/02/2018: 1. Lexiscan stress test was performed. Exercise capacity was not  assessed. Stress symptoms included dizziness. Blood pressure was elevated throughout the study. The resting and stress electrocardiogram demonstrated normal sinus rhythm, inferolateral T wave inversions, normal resting conduction, and no resting arrhythmias. Stress EKG is non diagnostic for ischemia as it is a pharmacologic stress. 2. The overall quality of the study is excellent. There is no evidence of abnormal lung activity. Stress and rest SPECT images demonstrate homogeneous tracer distribution throughout the myocardium. Gated SPECT imaging reveals normal myocardial thickening and wall motion. The left ventricular ejection fraction was normal (  63%).  3. Low risk study.  Echocardiogram 04/07/2020: Left ventricle cavity is normal in size. Moderate concentric hypertrophy of the left ventricle. Normal global wall motion. Normal LV systolic function with EF 70%. Doppler evidence of grade II (pseudonormal) diastolic dysfunction, elevated LAP.  Left atrial cavity is mildly dilated. Structurally normal trileaflet aortic valve. Mildly increased LVOT velocity due to severe LVH. No significant valvular stenosis. No regurgitation. Moderate (Grade II) mitral regurgitation. Mildly restricted mitral valve leaflets. Moderate tricuspid regurgitation. Estimated pulmonary artery systolic pressure 29 mmHg. No significant change compared to previous study in 2019.  Hypertensive cardiomyopathy likely etiology.    Genetic Testing:  Genetic testing (HCM next analysis of 30 genes associated with hypertrophic cardiomyopathy) results 04/13/2020:  Testing noted variant of unknown significance in [place] gene.   EKG:    EKG 07/20/2020: Marked sinus bradycardia at rate of 55 bpm, normal axis, LVH with repolarization abnormality, cannot exclude inferior and lateral ischemia.  No significant change from  EKG 02/10/2020.  Assessment     ICD-10-CM   1. Hypertensive heart disease without heart failure  I11.9 EKG 12-Lead     metoprolol succinate (TOPROL-XL) 100 MG 24 hr tablet  2. Dyspnea on exertion  R06.00   3. Dynamic left ventricular outflow obstruction  I51.89 metoprolol succinate (TOPROL-XL) 100 MG 24 hr tablet  4. OSA (obstructive sleep apnea)  G47.33   5. Essential hypertension  I10 amLODipine (NORVASC) 10 MG tablet    valsartan-hydrochlorothiazide (DIOVAN HCT) 160-12.5 MG tablet  6. Obstructive hypertrophic cardiomyopathy (HCC)  I42.1     Medications Discontinued During This Encounter  Medication Reason  . amLODipine (NORVASC) 10 MG tablet Reorder  . metoprolol succinate (TOPROL-XL) 100 MG 24 hr tablet Reorder  . valsartan-hydrochlorothiazide (DIOVAN HCT) 160-12.5 MG tablet Reorder   Meds ordered this encounter  Medications  . metoprolol succinate (TOPROL-XL) 100 MG 24 hr tablet    Sig: Take 1 tablet (100 mg total) by mouth daily. Take with or immediately following a meal.    Dispense:  90 tablet    Refill:  3  . amLODipine (NORVASC) 10 MG tablet    Sig: Take 1 tablet (10 mg total) by mouth daily.    Dispense:  90 tablet    Refill:  3  . valsartan-hydrochlorothiazide (DIOVAN HCT) 160-12.5 MG tablet    Sig: Take 1 tablet by mouth daily.    Dispense:  90 tablet    Refill:  3   Recommendations:   Renee Kelly  is a 65 y.o. AAF who was referred to [place] by her PCP for evaluation of cardiac murmur and seen by [place] on 01/01/18. Has past medical history of hypertension that has been managed with metoprolol and amlodipine 10 mg.  Echocardiogram 02/08/28 in revealed severe LVH with LVOT obstruction suggestive of HOCM versus severe hypertensive heart disease.Carotid duplex did not reveal any significant abnormality. Lexiscan nuclear stress test was non-ischemic. MRI in Apr 2021 confirmed HOCM.  The patient presents for 8 week follow up.  Blood pressure is now well controlled with addition of valsartan HCT, continue the same.  BNP stable and normal.  She is not also diagnosed with obstructive sleep apnea and  she is pending availability of a CPAP machine.  I again reviewed with her regarding her echocardiogram overall no significant change in LVOT obstruction or gradient.  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  Otherwise stable from cardiac standpoint, she has been losing weight and have encouraged her  to continue to lose weight and positive reinforcement was given to the patient.  I would like to see her back on annual basis.    Adrian Prows, MD, [facility] 07/20/2020, 2:22 PM Office: [phone]

## 2020-07-30 ENCOUNTER — Ambulatory Visit: Payer: Self-pay | Admitting: Family Medicine

## 2021-06-09 ENCOUNTER — Other Ambulatory Visit: Payer: Self-pay | Admitting: Cardiology

## 2021-06-09 DIAGNOSIS — I5189 Other ill-defined heart diseases: Secondary | ICD-10-CM

## 2021-06-09 DIAGNOSIS — I10 Essential (primary) hypertension: Secondary | ICD-10-CM

## 2021-06-09 DIAGNOSIS — I119 Hypertensive heart disease without heart failure: Secondary | ICD-10-CM

## 2021-07-21 ENCOUNTER — Ambulatory Visit: Payer: Medicare HMO | Admitting: Cardiology

## 2021-07-21 ENCOUNTER — Other Ambulatory Visit: Payer: Self-pay

## 2021-07-21 ENCOUNTER — Encounter: Payer: Self-pay | Admitting: Cardiology

## 2021-07-21 VITALS — BP 139/77 | HR 90 | Temp 97.8°F | Resp 16 | Ht 63.0 in | Wt 178.8 lb

## 2021-07-21 DIAGNOSIS — R0609 Other forms of dyspnea: Secondary | ICD-10-CM

## 2021-07-21 DIAGNOSIS — G4733 Obstructive sleep apnea (adult) (pediatric): Secondary | ICD-10-CM

## 2021-07-21 DIAGNOSIS — I119 Hypertensive heart disease without heart failure: Secondary | ICD-10-CM

## 2021-07-21 DIAGNOSIS — I421 Obstructive hypertrophic cardiomyopathy: Secondary | ICD-10-CM

## 2021-07-21 NOTE — Progress Notes (Signed)
Primary Physician/Referring:  Bartholome Bill, MD  Patient ID: Renee Kelly, female    DOB: 03-17-1956, 66 y.o.   MRN: [id number]  Chief Complaint  Patient presents with   Hypertension   HPI:    Renee Kelly  is a 66 y.o.  AAF with hypertension, mild obesity, HOCM confirmed by MRI, OSA on CPAP presents here for annual visit.   Carotid duplex did not reveal any significant abnormality. Lexiscan nuclear stress test was non-ischemic. MRI in Apr 2021 confirmed HOCM.  She is orginally from [place] and moved [place] the [place] ago.  She has OSA diagnosed in Oct 2021 and is on CPAP. Has been loosing weight intentionally. Otherwise except for minimal dyspnea on exertion, no new symptoms.   Past Medical History:  Diagnosis Date   Allergy    Arthritis    all over per pt.   Cataract    GERD (gastroesophageal reflux disease)    past hx only    Glaucoma    Heart disease    Heart murmur    Hypertension    Lactose intolerance    Ovary cancer ([place])    ovarian cancer   Sleep apnea    no cpap   Past Surgical History:  Procedure Laterality Date   COLONOSCOPY     2 past colonoscopies- 1st 3 polyps, 2nd no polyps- said return 10 yrs    NSVD     x 2    Social History   Tobacco Use   Smoking status: Never   Smokeless tobacco: Never  Substance Use Topics   Alcohol use: No   Marital Status: Single   ROS  Review of Systems  Cardiovascular:  Positive for dyspnea on exertion. Negative for chest pain and leg swelling.  Gastrointestinal:  Negative for melena.  Objective   Vitals with BMI 07/21/2021 07/20/2020 07/20/2020  Height 5\' 3"  - 5\' 3"   Weight 178 lbs 13 oz - 185 lbs 6 oz  BMI 86.76 - 19.50  Systolic 932 671 245  Diastolic 77 69 75  Pulse 90 62 43      Physical Exam Constitutional:      General: She is not in acute distress.    Appearance: She is well-developed. She is obese.  Eyes:     Conjunctiva/sclera: Conjunctivae normal.  Neck:     Thyroid: No thyromegaly.   Cardiovascular:     Rate and Rhythm: Normal rate and regular rhythm.     Pulses: Normal pulses and intact distal pulses.     Heart sounds: S1 normal and S2 normal. Murmur heard.  Harsh mid to late systolic murmur is present with a grade of 2/6 at the upper right sternal border and apex.    No gallop.     Comments: No leg edema, no JVD.  Pulmonary:     Effort: Pulmonary effort is normal.     Breath sounds: Normal breath sounds.  Abdominal:     General: Bowel sounds are normal.     Palpations: Abdomen is soft.   Laboratory examination:   BMP Latest Ref Rng & Units 02/25/2020 09/05/2019 05/09/2017  Glucose 65 - 99 mg/dL 115(H) 172(H) 105(H)  BUN 8 - 27 mg/dL 16 10 16   Creatinine 0.57 - 1.00 mg/dL 0.94 0.94 0.88  BUN/Creat Ratio 12 - 28 17 - 18  Sodium 134 - 144 mmol/L 138 137 143  Potassium 3.5 - 5.2 mmol/L 4.3 3.8 4.3  Chloride 96 - 106 mmol/L 98 103 104  CO2 20 - 29 mmol/L 26 25 31(H)  Calcium 8.7 - 10.3 mg/dL 10.0 9.7 9.7    CBC Latest Ref Rng & Units 09/05/2019 05/09/2017 11/02/2016  WBC 4.0 - 10.5 K/uL 8.1 6.5 7.9  Hemoglobin 12.0 - 15.0 g/dL 13.8 13.4 13.5  Hematocrit 36.0 - 46.0 % 42.5 41.9 41.4  Platelets 150 - 400 K/uL 285 233 248   External labs:  Labs 07/24/2020:  Total Cholesterol 25 - 199 MG/DL  192   Triglycerides 10 - 150 MG/DL   129   HDL Cholesterol 35 - 135 MG/DL  44   LDL Cholesterol Calculated 0 - 99 MG/DL  122 High    Non-HDL Cholesterol MG/DL    148   Sodium 135 - 146 MMOL/L   140   Potassium 3.5 - 5.3 MMOL/L   3.6   Chloride 98 - 110 MMOL/L   102   CO2 23 - 30 MMOL/L    30   BUN 8 - 24 MG/DL     24   Glucose 70 - 99 MG/DL    114 High  Est. GFR >=60 ML/MIN/1.73 M*2          Medications and allergies   Allergies  Allergen Reactions   Avocado     Nausea/Vomiting     Current Outpatient Medications on File Prior to Visit  Medication Sig Dispense Refill   amLODipine (NORVASC) 10 MG tablet Take 1 tablet by mouth once daily 90 tablet 0    Cholecalciferol (VITAMIN D3) 1000 units CAPS Take by mouth.     Cyanocobalamin (VITAMIN B 12 PO) Take 2 tablets by mouth daily.     LUMIGAN 0.01 % SOLN INSTILL 1 DROP INTO EACH EYE AT BEDTIME     metoprolol succinate (TOPROL-XL) 100 MG 24 hr tablet TAKE 1 TABLET BY MOUTH ONCE DAILY. TAKE WITH OR IMMEDIATELY FOLLOWING A MEAL. 90 tablet 0   Omega-3 Fatty Acids (FISH OIL) 1000 MG CAPS Take by mouth.     valsartan-hydrochlorothiazide (DIOVAN HCT) 160-12.5 MG tablet Take 1 tablet by mouth daily. 90 tablet 3   No current facility-administered medications on file prior to visit.   Radiology:     Cardiac Studies:   Carotid artery duplex 02-19-18: Minimal stenosis in bilateral internal carotid artery (1-15%). Antegrade right vertebral artery flow. Antegrade left vertebral artery flow.  Lexiscan myoview stress test 03/02/2018: 1. Lexiscan stress test was performed. Exercise capacity was not assessed. Stress symptoms included dizziness. Blood pressure was elevated throughout the study. The resting and stress electrocardiogram demonstrated normal sinus rhythm, inferolateral T wave inversions, normal resting conduction, and no resting arrhythmias.  Stress EKG is non diagnostic for ischemia as it is a pharmacologic stress. 2. The overall quality of the study is excellent. There is no evidence of abnormal lung activity. Stress and rest SPECT images demonstrate homogeneous tracer distribution throughout the myocardium. Gated SPECT imaging reveals normal myocardial thickening and wall motion. The left ventricular ejection fraction was normal (63%).   3. Low risk study.  Cardiac MR 10/30/2019: 1. Normal left ventricular size, with severe basal septal hypertrophy and moderate hypertrophy of the residual myocardium and hyperdynamic systolic function (LVEF = 74%) with gradient across the LVOT. There is a small apical aneurysm slightly enlarging in systole. No late gadolinium enhancement is seen in the left  ventricular myocardium. 2. Normal right ventricular size, thickness and systolic function (LVEF = 66%). There are no regional wall motion abnormalities. 3. Moderate to severe left atrial dilatation. 4. Normal  size of the aortic root, ascending aorta and pulmonary artery. 5. Mild to moderate mitral and trivial tricuspid regurgitation. 6. Normal pericardium. Trivial pericardial effusion.   These findings are consistent with typical form of hypertrophic cardiomyopathy with no late gadolinium enhancement and a small apical aneurysm.  LVOT gradient not mentioned.  Echocardiogram 04/07/2020: Left ventricle cavity is normal in size. Moderate concentric hypertrophy of the left ventricle. Normal global wall motion. Normal LV systolic function with EF 70%. Doppler evidence of grade II (pseudonormal) diastolic dysfunction, elevated LAP.  Left atrial cavity is mildly dilated. Structurally normal trileaflet aortic valve. Mildly increased LVOT velocity due to severe LVH. No significant valvular stenosis. No regurgitation. Moderate (Grade II) mitral regurgitation. Mildly restricted mitral valve leaflets. Moderate tricuspid regurgitation. Estimated pulmonary artery systolic pressure 29 mmHg. No significant change compared to previous study in 2019.  Hypertensive cardiomyopathy likely etiology.    Genetic Testing:  Genetic testing (HCM next analysis of 30 genes associated with hypertrophic cardiomyopathy) results 04/13/2020:  Testing noted variant of unknown significance in [place] gene.   EKG:  EKG 07/21/2021: Sinus rhythm with short PR interval, normal axis, LVH with repolarization abnormality, cannot exclude lateral ischemia.  No significant change from 07/20/2020, short PR interval was not previously read, baseline artifact was noted previously.   Assessment     ICD-10-CM   1. Hypertensive heart disease without heart failure  I11.9 EKG 12-Lead    2. Dyspnea on exertion  R06.09 PCV ECHOCARDIOGRAM COMPLETE     3. HOCM (hypertrophic obstructive cardiomyopathy) (HCC)  I42.1 PCV ECHOCARDIOGRAM COMPLETE    4. OSA (obstructive sleep apnea)  G47.33       There are no discontinued medications.  No orders of the defined types were placed in this encounter.  Recommendations:   Aisha Greenberger  is a 66 y.o. AAF with hypertension, mild obesity, HOCM confirmed by MRI, OSA on CPAP presents here for annual visit.   Carotid duplex did not reveal any significant abnormality. Lexiscan nuclear stress test was non-ischemic. MRI in Apr 2021 confirmed HOCM.  EKG demonstrates short PR interval but otherwise unremarkable and LVH with strain which is unchanged from previous EKG.  She has not had any palpitations or chest pain.  Dyspnea has remained stable, no PND or orthopnea or clinical evidence of heart failure.  External labs reviewed, renal function has remained stable and normal.  And this was a year ago, she is scheduled for repeat study and labs in 2 months.  She has been losing weight, very pleased with her progress.  Will repeat echocardiogram to reevaluate LVOT obstruction and gradient in view of underlying dyspnea.  Positive reinforcement regarding weight loss and continued exercise.  She has been using CPAP but not necessarily on a regular basis, importance of regular use of CPAP and to discuss with her sleep doctor regarding any problems with CPAP machine was stressed to the patient.  Otherwise no changes in the medications were done, blood pressure is very well controlled, in spite of LVOT obstruction she is on a small dose of a diuretic and also vasodilator drug, amlodipine which she is tolerating.  Continue the same for now.  If she continues to lose weight I would certainly discontinue amlodipine first.  Office visit in a year or sooner if problems.    Adrian Prows, MD, [facility] 07/21/2021, 1:32 PM Office: [phone]

## 2021-07-23 ENCOUNTER — Other Ambulatory Visit: Payer: Medicare HMO

## 2021-07-23 ENCOUNTER — Other Ambulatory Visit: Payer: Self-pay

## 2021-07-23 ENCOUNTER — Ambulatory Visit (HOSPITAL_COMMUNITY)
Admission: EM | Admit: 2021-07-23 | Discharge: 2021-07-23 | Disposition: A | Discharge status: Home / Self Care | Payer: Medicare HMO | Attending: Internal Medicine | Admitting: Internal Medicine

## 2021-07-23 ENCOUNTER — Encounter (HOSPITAL_COMMUNITY): Payer: Self-pay | Admitting: Emergency Medicine

## 2021-07-23 DIAGNOSIS — I10 Essential (primary) hypertension: Secondary | ICD-10-CM

## 2021-07-23 DIAGNOSIS — H539 Unspecified visual disturbance: Secondary | ICD-10-CM | POA: Diagnosis not present

## 2021-07-23 NOTE — ED Notes (Signed)
Patient is being discharged from the Urgent Care and sent to the Emergency Department via POV . Per Elta Guadeloupe, PA, patient is in need of higher level of care due to dizziness, palpitations, cognitive impairments. Patient is aware and verbalizes understanding of plan of care.  Vitals:   07/23/21 0952  BP: (!) 144/81  Pulse: 68  Resp: 18  Temp: 98.7 F (37.1 C)  SpO2: 99%
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
## 2021-07-23 NOTE — Discharge Instructions (Addendum)
ED via personal vehicle
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
## 2021-09-07 ENCOUNTER — Other Ambulatory Visit: Payer: Self-pay | Admitting: Cardiology

## 2021-09-07 DIAGNOSIS — I10 Essential (primary) hypertension: Secondary | ICD-10-CM

## 2021-09-07 DIAGNOSIS — I5189 Other ill-defined heart diseases: Secondary | ICD-10-CM

## 2021-09-07 DIAGNOSIS — I119 Hypertensive heart disease without heart failure: Secondary | ICD-10-CM

## 2022-01-20 ENCOUNTER — Other Ambulatory Visit: Payer: Medicare HMO

## 2022-01-31 ENCOUNTER — Ambulatory Visit: Payer: Medicare HMO

## 2022-01-31 DIAGNOSIS — R0609 Other forms of dyspnea: Secondary | ICD-10-CM

## 2022-01-31 DIAGNOSIS — I421 Obstructive hypertrophic cardiomyopathy: Secondary | ICD-10-CM

## 2022-02-08 ENCOUNTER — Other Ambulatory Visit: Payer: Self-pay | Admitting: Cardiology

## 2022-02-08 DIAGNOSIS — I119 Hypertensive heart disease without heart failure: Secondary | ICD-10-CM

## 2022-02-08 DIAGNOSIS — I10 Essential (primary) hypertension: Secondary | ICD-10-CM

## 2022-02-08 DIAGNOSIS — I5189 Other ill-defined heart diseases: Secondary | ICD-10-CM

## 2022-05-09 ENCOUNTER — Other Ambulatory Visit: Payer: Self-pay | Admitting: Cardiology

## 2022-05-09 DIAGNOSIS — I10 Essential (primary) hypertension: Secondary | ICD-10-CM

## 2022-05-09 DIAGNOSIS — I119 Hypertensive heart disease without heart failure: Secondary | ICD-10-CM

## 2022-05-09 DIAGNOSIS — I5189 Other ill-defined heart diseases: Secondary | ICD-10-CM

## 2022-07-19 ENCOUNTER — Other Ambulatory Visit: Payer: Self-pay | Admitting: Cardiology

## 2022-07-19 DIAGNOSIS — I119 Hypertensive heart disease without heart failure: Secondary | ICD-10-CM

## 2022-07-19 DIAGNOSIS — I10 Essential (primary) hypertension: Secondary | ICD-10-CM

## 2022-07-19 DIAGNOSIS — I5189 Other ill-defined heart diseases: Secondary | ICD-10-CM

## 2022-07-21 ENCOUNTER — Ambulatory Visit: Payer: Medicare HMO | Admitting: Cardiology

## 2022-07-26 ENCOUNTER — Encounter: Payer: Self-pay | Admitting: Cardiology

## 2022-07-26 ENCOUNTER — Ambulatory Visit: Payer: Medicare Other | Admitting: Cardiology

## 2022-07-26 VITALS — BP 121/79 | HR 73 | Resp 17 | Ht 63.0 in | Wt 181.6 lb

## 2022-07-26 DIAGNOSIS — I119 Hypertensive heart disease without heart failure: Secondary | ICD-10-CM

## 2022-07-26 DIAGNOSIS — I421 Obstructive hypertrophic cardiomyopathy: Secondary | ICD-10-CM

## 2022-07-26 DIAGNOSIS — R0609 Other forms of dyspnea: Secondary | ICD-10-CM

## 2022-07-26 NOTE — Progress Notes (Signed)
Primary Physician/Referring:  Bartholome Bill, MD  Patient ID: Renee Kelly, female    DOB: 05-Aug-1955, 67 y.o.   MRN: [id number]  Chief Complaint  Patient presents with   Hypertension   Shortness of Breath   Follow-up    1 year   HPI:    Renee Kelly  is a 67 y.o.  AAF with hypertension, mild obesity, HOCM confirmed by MRI, OSA on CPAP presents here for annual visit.    She is orginally from [place] and moved to the [place] ago.  Except for minimal dyspnea on exertion, no new symptoms.  No leg edema.  No PND or orthopnea.  Past Medical History:  Diagnosis Date   Allergy    Arthritis    all over per pt.   Cataract    GERD (gastroesophageal reflux disease)    past hx only    Glaucoma    Heart disease    Heart murmur    Hypertension    Lactose intolerance    Ovary cancer ([place])    ovarian cancer   Sleep apnea    no cpap   Past Surgical History:  Procedure Laterality Date   COLONOSCOPY     2 past colonoscopies- 1st 3 polyps, 2nd no polyps- said return 10 yrs    NSVD     x 2    Social History   Tobacco Use   Smoking status: Never   Smokeless tobacco: Never  Substance Use Topics   Alcohol use: No   Marital Status: Single   ROS  Review of Systems  Cardiovascular:  Positive for dyspnea on exertion. Negative for chest pain and leg swelling.  Gastrointestinal:  Negative for melena.   Objective      07/26/2022   10:31 AM 07/23/2021    9:52 AM 07/21/2021    1:15 PM  Vitals with BMI  Height '5\' 3"'$   '5\' 3"'$   Weight 181 lbs 10 oz  178 lbs 13 oz  BMI 23.76  28.31  Systolic 517 616 073  Diastolic 79 81 77  Pulse 73 68 90      Physical Exam Constitutional:      General: She is not in acute distress.    Appearance: She is well-developed. She is obese.  Eyes:     Conjunctiva/sclera: Conjunctivae normal.  Neck:     Thyroid: Thyromegaly present.     Vascular: No carotid bruit or JVD.  Cardiovascular:     Rate and Rhythm: Normal rate and regular rhythm.      Pulses: Normal pulses and intact distal pulses.     Heart sounds: S1 normal and S2 normal. Murmur heard.     Harsh mid to late systolic murmur is present with a grade of 2/6 at the upper right sternal border and apex.     No gallop.  Pulmonary:     Effort: Pulmonary effort is normal.     Breath sounds: Normal breath sounds.  Abdominal:     General: Bowel sounds are normal.     Palpations: Abdomen is soft.  Musculoskeletal:     Right lower leg: No edema.     Left lower leg: No edema.    Laboratory examination:   External labs:  Labs 02/14/2022:  Sodium 139, potassium 4.0, BUN 15, creatinine 1.17, EGFR 51 mL.  Labs 08/17/2021:  Total cholesterol 200, triglycerides 117, HDL 46, LDL 131.  Non-HDL cholesterol 154.  Hb 12.9/HCT 38.7, platelets 286.  A1c 5.3%.  Medications and allergies   Allergies  Allergen Reactions   Avocado     Nausea/Vomiting      Current Outpatient Medications:    amLODipine (NORVASC) 10 MG tablet, Take 1 tablet by mouth once daily, Disp: 90 tablet, Rfl: 0   Cholecalciferol (VITAMIN D3) 1000 units CAPS, Take by mouth., Disp: , Rfl:    Cyanocobalamin (VITAMIN B 12 PO), Take 2 tablets by mouth daily., Disp: , Rfl:    LUMIGAN 0.01 % SOLN, INSTILL 1 DROP INTO EACH EYE AT BEDTIME, Disp: , Rfl:    metoprolol succinate (TOPROL-XL) 100 MG 24 hr tablet, TAKE 1 TABLET BY MOUTH ONCE DAILY TAKE  WITH  OR  IMMEDIATELY  FOLLOWING  A  MEAL, Disp: 90 tablet, Rfl: 0   Omega-3 Fatty Acids (FISH OIL) 1000 MG CAPS, Take by mouth., Disp: , Rfl:    valsartan-hydrochlorothiazide (DIOVAN-HCT) 160-12.5 MG tablet, Take 1 tablet by mouth once daily, Disp: 90 tablet, Rfl: 0   Radiology:     Cardiac Studies:   Carotid artery duplex 02/22/18: Minimal stenosis in bilateral internal carotid artery (1-15%). Antegrade right vertebral artery flow. Antegrade left vertebral artery flow.  Lexiscan myoview stress test 03/02/2018: 1. Lexiscan stress test was performed. Exercise  capacity was not assessed. Stress symptoms included dizziness. Blood pressure was elevated throughout the study. The resting and stress electrocardiogram demonstrated normal sinus rhythm, inferolateral T wave inversions, normal resting conduction, and no resting arrhythmias.  Stress EKG is non diagnostic for ischemia as it is a pharmacologic stress. 2. The overall quality of the study is excellent. There is no evidence of abnormal lung activity. Stress and rest SPECT images demonstrate homogeneous tracer distribution throughout the myocardium. Gated SPECT imaging reveals normal myocardial thickening and wall motion. The left ventricular ejection fraction was normal (63%).   3. Low risk study.  Cardiac MR 10/30/2019: 1. Normal left ventricular size, with severe basal septal hypertrophy and moderate hypertrophy of the residual myocardium and hyperdynamic systolic function (LVEF = 74%) with gradient across the LVOT. There is a small apical aneurysm slightly enlarging in systole. No late gadolinium enhancement is seen in the left ventricular myocardium. 2. Normal right ventricular size, thickness and systolic function (LVEF = 66%). There are no regional wall motion abnormalities. 3. Moderate to severe left atrial dilatation. 4. Normal size of the aortic root, ascending aorta and pulmonary artery. 5. Mild to moderate mitral and trivial tricuspid regurgitation. 6. Normal pericardium. Trivial pericardial effusion.   These findings are consistent with typical form of hypertrophic cardiomyopathy with no late gadolinium enhancement and a small apical aneurysm.  LVOT gradient not mentioned.  Echocardiogram 04/07/2020: Left ventricle cavity is normal in size. Moderate concentric hypertrophy of the left ventricle. Normal global wall motion. Normal LV systolic function with EF 70%. Doppler evidence of grade II (pseudonormal) diastolic dysfunction, elevated LAP.  Left atrial cavity is mildly dilated. Structurally  normal trileaflet aortic valve. Mildly increased LVOT velocity due to severe LVH. No significant valvular stenosis. No regurgitation. Moderate (Grade II) mitral regurgitation. Mildly restricted mitral valve leaflets. Moderate tricuspid regurgitation. Estimated pulmonary artery systolic pressure 29 mmHg. No significant change compared to previous study in 2019.  Hypertensive cardiomyopathy likely etiology.    Genetic Testing:  Genetic testing (HCM next analysis of 30 genes associated with hypertrophic cardiomyopathy) results 04/13/2020:  Testing noted variant of unknown significance in [place] gene.   EKG:  Labs 07/26/2022: Normal sinus rhythm at the rate of 55 bpm, left atrial enlargement, normal axis.  LVH with repolarization abnormality, cannot exclude inferior and lateral ischemia.  Compared to 07/21/2021, no significant change.   Assessment     ICD-10-CM   1. Hypertensive heart disease without heart failure  I11.9 EKG 12-Lead    PCV ECHOCARDIOGRAM COMPLETE    2. HOCM (hypertrophic obstructive cardiomyopathy) (HCC)  I42.1 EKG 12-Lead    PCV ECHOCARDIOGRAM COMPLETE    3. Dyspnea on exertion  R06.09       There are no discontinued medications.  No orders of the defined types were placed in this encounter.  Recommendations:   Renee Kelly  is a 67 y.o. AAF with hypertension, mild obesity, HOCM confirmed by MRI, OSA on CPAP presents here for annual visit.    She is orginally from [place] and [place].  Has been loosing weight intentionally. Otherwise except for minimal dyspnea on exertion, no new symptoms.   1. Hypertensive heart disease without heart failure Blood pressure is under excellent control.  No changes in the EKG, she does have abnormal EKG related to hypertensive heart disease and also HOCM.  2. HOCM (hypertrophic obstructive cardiomyopathy) ([place]) In spite of LVOT obstruction she is on a small dose of a diuretic valsartan HCT and also vasodilator  drug, amlodipine which she is tolerating.  Continue the same for now.   3. Dyspnea on exertion Dyspnea on exertion has remained stable.  No PND or orthopnea, no clinical evidence of heart failure, no leg edema or JVD.  Dyspnea is related to both hypertensive heart disease and HOCM, I will repeat echocardiogram.  Encouraged her to lose weight and continue to exercise regularly.   Adrian Prows, MD, [facility] 07/26/2022, 11:11 AM Office: [phone]

## 2022-07-29 ENCOUNTER — Ambulatory Visit: Payer: Medicare Other

## 2022-07-29 DIAGNOSIS — I421 Obstructive hypertrophic cardiomyopathy: Secondary | ICD-10-CM

## 2022-07-29 DIAGNOSIS — I119 Hypertensive heart disease without heart failure: Secondary | ICD-10-CM

## 2022-08-02 ENCOUNTER — Other Ambulatory Visit: Payer: Self-pay | Admitting: Cardiology

## 2022-08-02 DIAGNOSIS — I5189 Other ill-defined heart diseases: Secondary | ICD-10-CM

## 2022-08-02 DIAGNOSIS — I119 Hypertensive heart disease without heart failure: Secondary | ICD-10-CM

## 2022-08-02 DIAGNOSIS — I10 Essential (primary) hypertension: Secondary | ICD-10-CM

## 2022-10-03 ENCOUNTER — Other Ambulatory Visit: Payer: Self-pay | Admitting: Cardiology

## 2022-10-03 DIAGNOSIS — I119 Hypertensive heart disease without heart failure: Secondary | ICD-10-CM

## 2022-10-03 DIAGNOSIS — I5189 Other ill-defined heart diseases: Secondary | ICD-10-CM

## 2022-10-03 DIAGNOSIS — I10 Essential (primary) hypertension: Secondary | ICD-10-CM

## 2022-10-18 ENCOUNTER — Other Ambulatory Visit: Payer: Self-pay | Admitting: Cardiology

## 2022-10-18 DIAGNOSIS — I10 Essential (primary) hypertension: Secondary | ICD-10-CM

## 2022-12-16 ENCOUNTER — Other Ambulatory Visit: Payer: Self-pay | Admitting: Cardiology

## 2022-12-16 DIAGNOSIS — I5189 Other ill-defined heart diseases: Secondary | ICD-10-CM

## 2022-12-16 DIAGNOSIS — I119 Hypertensive heart disease without heart failure: Secondary | ICD-10-CM

## 2022-12-31 ENCOUNTER — Other Ambulatory Visit: Payer: Self-pay | Admitting: Cardiology

## 2022-12-31 DIAGNOSIS — I10 Essential (primary) hypertension: Secondary | ICD-10-CM

## 2023-03-14 ENCOUNTER — Other Ambulatory Visit: Payer: Self-pay | Admitting: Cardiology

## 2023-03-14 DIAGNOSIS — I10 Essential (primary) hypertension: Secondary | ICD-10-CM

## 2023-05-29 ENCOUNTER — Other Ambulatory Visit: Payer: Self-pay | Admitting: Cardiology

## 2023-05-29 DIAGNOSIS — I10 Essential (primary) hypertension: Secondary | ICD-10-CM

## 2023-07-31 ENCOUNTER — Ambulatory Visit: Payer: Self-pay | Admitting: Cardiology

## 2023-08-29 ENCOUNTER — Other Ambulatory Visit: Payer: Self-pay | Admitting: Cardiology

## 2023-08-29 DIAGNOSIS — I119 Hypertensive heart disease without heart failure: Secondary | ICD-10-CM

## 2023-08-29 DIAGNOSIS — I5189 Other ill-defined heart diseases: Secondary | ICD-10-CM

## 2023-11-06 ENCOUNTER — Telehealth: Payer: Self-pay | Admitting: Cardiology

## 2023-11-06 DIAGNOSIS — I10 Essential (primary) hypertension: Secondary | ICD-10-CM

## 2023-11-06 DIAGNOSIS — I5189 Other ill-defined heart diseases: Secondary | ICD-10-CM

## 2023-11-06 DIAGNOSIS — I119 Hypertensive heart disease without heart failure: Secondary | ICD-10-CM

## 2023-11-06 MED ORDER — AMLODIPINE BESYLATE 10 MG PO TABS: 10.0000 mg | ORAL_TABLET | Freq: Every day | ORAL | 1 refills | Status: AC

## 2023-11-06 MED ORDER — METOPROLOL SUCCINATE ER 100 MG PO TB24
100.0000 mg | ORAL_TABLET | Freq: Every day | ORAL | 1 refills | Status: AC
Start: 2023-11-06 — End: ?

## 2023-11-06 NOTE — Telephone Encounter (Signed)
 Pt's medication was sent to pt's pharmacy as requested. Confirmation received.

## 2023-11-06 NOTE — Telephone Encounter (Signed)
*  STAT* If patient is at the pharmacy, call can be transferred to refill team.   1. Which medications need to be refilled? (please list name of each medication and dose if known) amLODipine  (NORVASC ) 10 MG tablet  metoprolol  succinate (TOPROL -XL) 100 MG 24 hr tablet  valsartan -hydrochlorothiazide  (DIOVAN -HCT) 160-12.5 MG tablet    2. Would you like to learn more about the convenience, safety, & potential cost savings by using the [facility]?     3. Are you open to using the [facility] (Type [facility].  ).   4. Which pharmacy/location (including street and city if local pharmacy) is medication to be sent to? [facility] - [place] ([place]), [place] - [address]    5. Do they need a 30 day or 90 day supply? 90 day

## 2024-01-01 ENCOUNTER — Ambulatory Visit: Attending: Cardiology | Admitting: Cardiology

## 2024-01-01 ENCOUNTER — Encounter: Payer: Self-pay | Admitting: Cardiology

## 2024-01-01 ENCOUNTER — Other Ambulatory Visit: Payer: Self-pay | Admitting: *Deleted

## 2024-01-01 VITALS — BP 154/78 | HR 64 | Resp 16 | Ht 63.0 in | Wt 181.0 lb

## 2024-01-01 DIAGNOSIS — I421 Obstructive hypertrophic cardiomyopathy: Secondary | ICD-10-CM

## 2024-01-01 DIAGNOSIS — I10 Essential (primary) hypertension: Secondary | ICD-10-CM

## 2024-01-01 DIAGNOSIS — E049 Nontoxic goiter, unspecified: Secondary | ICD-10-CM

## 2024-01-01 DIAGNOSIS — R0609 Other forms of dyspnea: Secondary | ICD-10-CM | POA: Diagnosis not present

## 2024-01-01 DIAGNOSIS — G4733 Obstructive sleep apnea (adult) (pediatric): Secondary | ICD-10-CM

## 2024-01-01 MED ORDER — SPIRONOLACTONE 25 MG PO TABS: 25.0000 mg | ORAL_TABLET | ORAL | 3 refills | Status: AC

## 2024-01-01 NOTE — Patient Instructions (Addendum)
 Medication Instructions:  Your physician has recommended you make the following change in your medication: Start spironolactone 25 mg by mouth daily   *If you need a refill on your cardiac medications before your next appointment, please call your pharmacy*  Lab Work: Have lab work (BMP and TSH) drawn in 3-4 week.  This is not fasting.  Can be done at any [facility].  There is a [facility] on the first floor of our building If you have labs (blood work) drawn today and your tests are completely normal, you will receive your results only by: [employer] (if you have MyChart) OR A paper copy in the mail If you have any lab test that is abnormal or we need to change your treatment, we will call you to review the results.  Testing/Procedures: none  Follow-Up: At [facility], you and your health needs are our priority.  As part of our continuing mission to provide you with exceptional heart care, our providers are all part of one team.  This team includes your primary Cardiologist (physician) and Advanced Practice Providers or APPs (Physician Assistants and Nurse Practitioners) who all work together to provide you with the care you need, when you need it.  Your next appointment:   12 month(s)  Provider:   Gordy Bergamo, MD    We recommend signing up for the patient portal called MyChart.  Sign up information is provided on this After Visit Summary.  MyChart is used to connect with patients for Virtual Visits (Telemedicine).  Patients are able to view lab/test results, encounter notes, upcoming appointments, etc.  Non-urgent messages can be sent to your provider as well.   To learn more about what you can do with MyChart, go to [email or web address].   Other Instructions    You have been referred to [facility] Neurology for sleep apnea--Dr Buck

## 2024-01-01 NOTE — Progress Notes (Signed)
 Cardiology Office Note:  .   Date:  01/01/2024  ID:  Renee Kelly, DOB February 22, 1956, MRN [id number] PCP: Jolee Madelin Patch, MD  [facility] Providers Cardiologist:  Gordy Bergamo, MD   History of Present Illness: Renee Kelly   Renee Kelly is a 68 y.o. 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  She has had a nuclear stress test that was negative in 2019 and low risk, cardiac MRI on 10/30/2019 revealing hyperdynamic LVEF with severe basal septal hypertrophy and moderate generalized hypertrophy with apical aneurysm without any late gadolinium enhancement with severe LAE and mild to moderate MR.  Echocardiogram similarly revealed pseudonormal dysfunction but otherwise no change from the above MRI findings.  Discussed the use of AI scribe software for clinical note transcription with the patient, who gave verbal consent to proceed.  History of Present Illness Renee Kelly is a 68 year old female with hypertrophic cardiomyopathy who presents for blood pressure management. Her blood pressure has increased to 170/94 mmHg, despite previously normal levels. Her weight is currently 180-181 pounds, slightly down from 184 pounds since her last visit. She is attempting weight loss and reports reduced food intake.  Physical activity is limited. She walks occasionally but inconsistently due to weather conditions. She has not engaged in regular exercise or joined a gym.  She has severe sleep apnea but has not been using her CPAP machine due to mask fit issues and dryness upon waking. She has not resumed its use since the COVID-19 pandemic.  She is on valsartan  for blood pressure management. She also has a thyroid  goiter, but TSH levels have not been checked since 2018.  Labs   External Labs:  Care everywhere labs 08/30/2023:  Sodium 140, potassium 4.1, BUN 14, creatinine 1, EGFR 61 mL,  LFTs normal.  Total cholesterol 173, triglycerides 127, HDL 48, LDL 103.  Hb 14.1/HCT 42.3, platelets 183.  TSH 2018 normal.  ROS  Review of Systems  Cardiovascular:  Negative for chest pain, dyspnea on exertion and leg swelling.    Physical Exam:   VS:  BP (!) 154/78 (BP Location: Left Arm, Patient Position: Sitting, Cuff Size: Large)   Pulse 64   Resp 16   Ht 5' 3 (1.6 m)   Wt 181 lb (82.1 kg)   SpO2 96%   BMI 32.06 kg/m    Wt Readings from Last 3 Encounters:  01/01/24 181 lb (82.1 kg)  07/26/22 181 lb 9.6 oz (82.4 kg)  07/21/21 178 lb 12.8 oz (81.1 kg)    Physical Exam Constitutional:      Appearance: She is obese.  Neck:     Vascular: No JVD.   Cardiovascular:     Rate and Rhythm: Normal rate and regular rhythm.     Pulses: Intact distal pulses.     Heart sounds: S1 normal and S2 normal. Murmur heard.     Early systolic murmur is present with a grade of 2/6 at the upper right sternal border.     No gallop.  Pulmonary:     Effort: Pulmonary effort is normal.     Breath sounds: Normal breath sounds.  Abdominal:     General: Bowel sounds are normal.     Palpations: Abdomen is soft.   Musculoskeletal:     Right lower leg: No edema.     Left lower leg: No edema.    Pertinent studies:  Echocardiogram 07/29/2022: Hyperdynamic LV systolic function with visual EF >70%. Mild concentric hypertrophy of the left ventricle, presence of a septal bulge (peak LVOT gradient 10.40mmHG). Left ventricle cavity is normal in size. Normal global wall motion. Normal diastolic filling pattern, normal LAP. Calculated EF 77%. Mild tricuspid regurgitation. No evidence of pulmonary hypertension. Compared to 04/07/2020 G2DD is now normal, moderate MR has resolved, Moderate TR is now mild, otherwise no significant change.  Genetic Testing:   Genetic testing (HCM next analysis of 30 genes associated with hypertrophic cardiomyopathy) results 04/13/2020:  Testing noted variant of unknown  significance in [place] H7 gene.   Sleep study 04/15/2020: Severe OSA.  EKG:    EKG Interpretation Date/Time:  Monday January 01 2024 09:19:25 EDT Ventricular Rate:  60 PR Interval:  150 QRS Duration:  68 QT Interval:  388 QTC Calculation: 388 R Axis:   35  Text Interpretation: EKG 01/01/2024: Normal sinus rhythm at rate of 60 bpm, normal axis, LVH with repolarization abnormality lateral leads.  Single PAC.  Compared to 07/23/2021, T wave abnormality less prominent. Confirmed by Johnella Crumm, Jagadeesh ([id number]) on 01/01/2024 9:25:38 AM    Medications ordered    Meds ordered this encounter  Medications   spironolactone (ALDACTONE) 25 MG tablet    Sig: Take 1 tablet (25 mg total) by mouth every morning.    Dispense:  90 tablet    Refill:  3     ASSESSMENT AND PLAN: .      ICD-10-CM   1. HOCM (hypertrophic obstructive cardiomyopathy) (HCC)  I42.1 EKG 12-Lead    Basic Metabolic Panel (BMET)    2. Dyspnea on exertion  R06.09 Basic Metabolic Panel (BMET)    TSH    3. Essential hypertension  I10 spironolactone (ALDACTONE) 25 MG tablet    Basic Metabolic Panel (BMET)    4. Goiter  E04.9 TSH    5. OSA (obstructive sleep apnea)  G47.33 Ambulatory referral to Neurology      Orders Placed This Encounter  Procedures   Basic Metabolic Panel (BMET)    Standing Status:   Future    Number of Occurrences:   1    Expected Date:   01/24/2024    Expiration Date:   12/31/2024    Has the patient fasted?:   No    Remote health to draw?:   No   TSH    Standing Status:   Future    Number of Occurrences:   1    Expected Date:   01/24/2024    Expiration Date:   12/31/2024    Remote health to draw?:   No   Ambulatory referral to Neurology    Referral Priority:   Routine    Referral Type:   Consultation    Referral Reason:   Specialty Services Required    Referred to Provider:   Buck Saucer, MD    Requested Specialty:   Neurology    Number of Visits Requested:   1   EKG 12-Lead    Meds ordered this  encounter  Medications   spironolactone (ALDACTONE) 25 MG tablet    Sig: Take 1 tablet (25 mg total) by mouth every morning.    Dispense:  90 tablet    Refill:  3   Assessment & Plan Hypertrophic cardiomyopathy Hypertrophic cardiomyopathy with thickened myocardium. Emphasized lifestyle modifications to prevent complications, as there is no definitive treatment. Discussed weight loss and regular exercise to improve long-term outcomes and prevent deterioration by age 66. - Encourage  weight loss through diet and exercise. - Advise joining a gym for regular exercise.  Hypertension Hypertension with current blood pressure of 170/94 mmHg. Target blood pressure is 130/80 mmHg. Initiated spironolactone 25 mg once daily in the morning to lower blood pressure. - Prescribe spironolactone 25 mg once daily in the morning. - Monitor blood pressure regularly with a goal of 130/80 mmHg. - Follow up with Dr. Madelin Brought in two months for blood pressure management. - Consider increasing valsartan  HCT dose if blood pressure remains uncontrolled. - BMP in 3 to [redacted] weeks along with TSH   Severe sleep apnea Severe sleep apnea with non-compliance to CPAP therapy, sleep study was in 2021. Discussed importance of CPAP for managing sleep apnea and its impact on blood pressure control. Identified issues with mask fit and dryness, advised CPAP machine adjustments to increase humidity. - Refer to Dr. True Mar for sleep apnea management. - Advise adjusting CPAP machine settings to increase humidity. - Untreated sleep apnea probably contributing to uncontrolled hypertension as well  Goiter Goiter with no recent TSH check since 2018. Plan to evaluate thyroid  function with TSH testing. - Order TSH test in 3-4 weeks.     Signed,  Gordy Bergamo, MD, [facility] 01/01/2024, 9:53 AM [facility] [address] [place], [place] [address] Phone: [phone]. Fax:  [phone]

## 2024-02-07 ENCOUNTER — Ambulatory Visit: Admitting: Neurology

## 2024-02-07 ENCOUNTER — Encounter: Payer: Self-pay | Admitting: Neurology

## 2024-02-07 VITALS — BP 153/95 | Temp 84.0°F | Ht 63.0 in | Wt 181.0 lb

## 2024-02-07 DIAGNOSIS — E66811 Obesity, class 1: Secondary | ICD-10-CM

## 2024-02-07 DIAGNOSIS — Z789 Other specified health status: Secondary | ICD-10-CM

## 2024-02-07 DIAGNOSIS — G4733 Obstructive sleep apnea (adult) (pediatric): Secondary | ICD-10-CM | POA: Diagnosis not present

## 2024-02-07 NOTE — Progress Notes (Signed)
 Subjective:    Patient ID: Renee Kelly is a 68 y.o. female.  HPI    True Mar, MD, PhD [facility] [address], [address] [address] [place], [place] [address]  Dear Gordy,  I saw your patient, Renee Kelly, upon your kind request in my sleep clinic today for evaluation of her sleep disorder, in particular, her prior diagnosis of obstructive sleep apnea.  The patient is unaccompanied today.  As you know, Renee Kelly is a 68 year old female with an underlying medical history of HOCM, hypertension, goiter, allergies, arthritis, cataracts, glaucoma, ovarian cancer, reflux disease, and obesity, who was previously diagnosed with obstructive sleep apnea.  I had evaluated her in 2021, at which time she reported a prior diagnosis of OSA but was no longer on PAP therapy.  She had a home sleep test through our office on 04/15/2020 which showed severe obstructive sleep apnea with a total AHI of 42/hour and O2 nadir of 83%.   She received a CPAP machine on 09/21/2020.  She has not used it in a few years.  She reports having difficulty with the mask and mouth dryness.  She has not had a recent mask fit appointment.  She feels that she was sent the wrong mask.  She has not changed the humidity setting but has used the humidifier in the machine.  Compliance data does not show any usage recently, download from mid June through mid July 2022 showed 7 days of usage in 30 days.  Leak from the mask was high at the time, AutoPap setting of 4 to 20 cm with EPR of 1, she has a ResMed air sense 11 AutoSet machine.  DME provider is [facility].  She goes to bed generally between 10 and 11 and rise time is around 7 or 8.  She has no nightly nocturia, she has had occasional morning headaches.  She drinks caffeine in the form of tea, about 2 cups/day. I reviewed your office note from 01/01/2024.   Previously:   03/19/20: 68 year old right-handed woman with an underlying medical history of reflux  disease, headaches, anosmia (has seen Ines for this), cataracts, arthritis, allergies, hypertension, HOCM, ovarian cancer and obesity, who was previously diagnosed with obstructive sleep apnea. She is currently not on CPAP therapy. Prior sleep study results are not available for my review today, testing was over 5 years ago. I reviewed your office note from 02/10/2020. Her Epworth sleepiness score is 12/24, fatigue severity score is 46 out of 63.  She reports that her study was done in [place]  about 7 years ago or so.  As far she recalls she was told she had severe sleep apnea.  She decided not to use CPAP therapy because of the noise of the machine.  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  Nevertheless, she would be willing to get reevaluated and consider CPAP therapy at this time.  Her son has sleep apnea and his machine is not loud.  Another son has a pacemaker placed.  She goes to bed around 11, rise time is around 11 or noon, she is typically awake by 7 or latest by 9 but lays in bed.  She has a TV in the bedroom but turns it off when she is ready to fall asleep.  She has no pets in the household, lives alone, she is divorced and has 2 grown sons.  She is retired.  She  is a non-smoker and does not drink alcohol, does not drink caffeine on a daily basis.  She drinks herbal tea and water, occasional soda.  She has had some teeth extractions and is seeking further evaluation for her dental problems.  She has nocturia about 2-3 times per average night and has woken up occasionally with a headache, not enough to take medication.     Her Past Medical History Is Significant For: Past Medical History:  Diagnosis Date   Allergy    Arthritis    all over per pt.   Cataract    GERD (gastroesophageal reflux disease)    past hx only    Glaucoma    Heart disease    Heart murmur    Hypertension    Lactose intolerance    Ovary cancer (HCC)     ovarian cancer   Sleep apnea    no cpap    Her Past Surgical History Is Significant For: Past Surgical History:  Procedure Laterality Date   COLONOSCOPY     2 past colonoscopies- 1st 3 polyps, 2nd no polyps- said return 10 yrs    NSVD     x 2     Her Family History Is Significant For: Family History  Problem Relation Age of Onset   Alzheimer's disease Mother    Liver disease Father    Colon polyps Sister    Hypertension Sister    Other Sister    Hypertension Sister    Hypertension Sister    Hypertension Brother    Sleep apnea Son    Colon cancer Neg Hx    Esophageal cancer Neg Hx    Rectal cancer Neg Hx    Stomach cancer Neg Hx     Her Social History Is Significant For: Social History   Socioeconomic History   Marital status: Single    Spouse name: Not on file   Number of children: 2   Years of education: Not on file   Highest education level: Not on file  Occupational History   Not on file  Tobacco Use   Smoking status: Never   Smokeless tobacco: Never  Vaping Use   Vaping status: Never Used  Substance and Sexual Activity   Alcohol use: No   Drug use: No   Sexual activity: Not on file  Other Topics Concern   Not on file  Social History Narrative   Lives at home alone   Right handed    Sometimes drinks tea   Retired    Social Drivers of Corporate investment banker Strain: Not on file  Food Insecurity: Low Risk  (08/30/2023)   Received from [facility]   Hunger Vital Sign    Within the past 12 months, you worried that your food would run out before you got money to buy more: Never true    Within the past 12 months, the food you bought just didn't last and you didn't have money to get more. : Never true  Transportation Needs: No Transportation Needs (08/30/2023)   Received from [facility]    In the past 12 months, has lack of reliable transportation kept you from medical appointments, meetings, work or from getting things needed  for daily living? : No  Physical Activity: Not on file  Stress: Not on file  Social Connections: Not on file    Her Allergies Are:  Allergies  Allergen Reactions   Avocado     Nausea/Vomiting   :  Her Current Medications Are:  Outpatient Encounter Medications as of 02/07/2024  Medication Sig   ascorbic acid (VITAMIN C) 500 MG tablet Take 500 mg by mouth daily.   aspirin 81 MG chewable tablet Chew 81 mg by mouth.   Cholecalciferol (VITAMIN D3) 1000 units CAPS Take by mouth.   Cyanocobalamin (VITAMIN B 12 PO) Take 2 tablets by mouth daily.   Omega-3 Fatty Acids (FISH OIL) 1000 MG CAPS Take by mouth.   amLODipine  (NORVASC ) 10 MG tablet Take 1 tablet (10 mg total) by mouth daily. (Patient not taking: Reported on 02/07/2024)   LUMIGAN 0.01 % SOLN INSTILL 1 DROP INTO EACH EYE AT BEDTIME (Patient not taking: Reported on 02/07/2024)   metoprolol  succinate (TOPROL -XL) 100 MG 24 hr tablet Take 1 tablet (100 mg total) by mouth daily. Take with or immediately following a meal. (Patient not taking: Reported on 02/07/2024)   spironolactone  (ALDACTONE ) 25 MG tablet Take 1 tablet (25 mg total) by mouth every morning. (Patient not taking: Reported on 02/07/2024)   valsartan -hydrochlorothiazide  (DIOVAN -HCT) 160-12.5 MG tablet Take 1 tablet by mouth once daily (Patient not taking: Reported on 02/07/2024)   No facility-administered encounter medications on file as of 02/07/2024.  :   Review of Systems:  Out of a complete 14 point review of systems, all are reviewed and negative with the exception of these symptoms as listed below:  Review of Systems  Neurological:        Pt here for sleep consult Pt snores,fatigue,hypertension,headaches Pt states hasn't worn cpap in 3 years and doesn't remember last sleep study was taken     Objective:  Neurological Exam  Physical Exam Physical Examination:   Vitals:   02/07/24 1027  BP: (!) 153/95  Temp: (!) 84 F (28.9 C)    General Examination: The  patient is a very pleasant 68 y.o. female in no acute distress. She appears well-developed and well-nourished and well groomed.   HEENT: Normocephalic, atraumatic, pupils are equal, round and reactive to light, extraocular tracking is good without limitation to gaze excursion or nystagmus noted. Hearing is grossly intact. Face is symmetric with normal facial animation. Speech is clear with no dysarthria noted. There is no hypophonia. There is no lip, neck/head, jaw or voice tremor. There are no carotid bruits on auscultation. Oropharynx exam reveals: mild to moderate mouth dryness, adequate dental hygiene, several missing teeth, moderate airway crowding secondary to tonsillar size of about 3+, larger uvula, slightly wider tongue, tongue protrudes centrally in palate elevates symmetrically.  Mallampati class I, minimal overbite noted, neck circumference of 17 3/8 inches.     Chest: Clear to auscultation without wheezing, rhonchi or crackles noted.   Heart: S1+S2+0, regular with 2/6 systolic murmur noted.    Abdomen: Soft, non-tender and non-distended with normal bowel sounds appreciated on auscultation.   Extremities: There is non-pitting puffiness noted in the right ankle and foot, more so right side.     Skin: Warm and dry without trophic changes noted.    Musculoskeletal: exam reveals no obvious joint deformities.        Neurologically:  Mental status: The patient is awake, alert and oriented in all 4 spheres. Her immediate and remote memory, attention, language skills and fund of knowledge are appropriate. There is no evidence of aphasia, agnosia, apraxia or anomia. Speech is clear with normal prosody and enunciation. Thought process is linear. Mood is normal and affect is normal.  Cranial nerves II - XII are as described above under HEENT exam.  Motor exam: Normal bulk, strength and tone is noted. There is no obvious resting or action tremor. Fine motor skills and coordination: grossly intact.   Cerebellar testing: No dysmetria or intention tremor. There is no truncal or gait ataxia.  Sensory exam: intact to light touch in the upper and lower extremities.  Gait, station and balance: She stands easily. No veering to one side is noted. No leaning to one side is noted. Posture is age-appropriate and stance is narrow based. Gait shows normal stride length and normal pace. No problems turning are noted.    Assessment and Plan:  In summary, Jasiya Markie is a 68 year old female with an underlying medical history of HOCM, hypertension, goiter, allergies, arthritis, cataracts, glaucoma, ovarian cancer, reflux disease, and obesity, who presents for reevaluation of her severe obstructive sleep apnea.  She has not used her AutoPap machine in several years.  She is encouraged to get back on treatment.  She is not quite eligible for a new machine.  I do not recommend leaving severe sleep apnea untreated, we talked about the risks and ramifications of significant OSA again today.  She is advised to make an appointment with her DME provider for mask refit, they can also help her adjust the humidity setting on her machine.  I placed an order for this.  She is agreeable to restarting PAP therapy.  I do not believe we need to pursue a new home sleep test at this time but we can consider testing again when she gets eligible for a new machine next year.  We talked about the importance of maintaining a healthy lifestyle and good sleep hygiene.  She is encouraged to keep a set time for her sleep and rise time.  She is advised to follow-up routinely in this clinic to see one of our nurse practitioners or myself in about 3 months, sooner if needed.  I answered all her questions today and she was in agreement.   Thank you very much for allowing me to participate in the care of this nice patient. If I can be of any further assistance to you please do not hesitate to call me at [phone].   Sincerely,     True Mar,  MD, PhD

## 2024-02-07 NOTE — Patient Instructions (Addendum)
 Please restart using your autoPAP regularly. While your insurance requires that you use your PAP at least 4 hours each night on 70% of the nights, I recommend, that you not skip any nights and use it throughout the night if you can. Getting used to CPAP and staying with the treatment long term does take time and patience and discipline. Untreated obstructive sleep apnea when it is moderate to severe can have an adverse impact on cardiovascular health and raise her risk for heart disease, arrhythmias, hypertension, congestive heart failure, stroke and diabetes. Untreated obstructive sleep apnea causes sleep disruption, nonrestorative sleep, and sleep deprivation. This can have an impact on your day to day functioning and cause daytime sleepiness and impairment of cognitive function, memory loss, mood disturbance, and problems focussing. Using CPAP regularly can improve these symptoms.

## 2024-02-12 NOTE — Progress Notes (Signed)
DME order sent to [facility].

## 2024-02-13 NOTE — Progress Notes (Signed)
 New, Maryella Shivers, Otilio Jefferson, RN; Alain Honey; Jeris Penta, [place]; 1 other Received, thank you!

## 2024-02-19 ENCOUNTER — Telehealth: Payer: Self-pay | Admitting: *Deleted

## 2024-02-19 DIAGNOSIS — G4733 Obstructive sleep apnea (adult) (pediatric): Secondary | ICD-10-CM

## 2024-02-19 DIAGNOSIS — E66811 Obesity, class 1: Secondary | ICD-10-CM

## 2024-02-19 DIAGNOSIS — Z789 Other specified health status: Secondary | ICD-10-CM

## 2024-02-19 NOTE — Telephone Encounter (Signed)
 FW: order Received: 4 days ago Hilliard Heather CROME, RN  P Gna-[facility] 4 Calls    Previous Messages    ----- Message ----- From: Joylene Cain Sent: 02/15/2024  12:24 PM EDT To: Cain Joylene; Avelina Sprung; Ephraim Dollar* Subject: RE: order                                      Hello [place],  I will need OV face 2 face notes showing 30 day compliance with usage and benefit notes or patinet may need a new sleep study. Notes state patient has not used in some time.  Thank you,  Arvella New ----- Message ----- From: Joylene Cain Sent: 02/13/2024   4:48 PM EDT To: Cain Joylene; Avelina Sprung; Ephraim Dollar* Subject: RE: order                                      Received, thank you! ----- Message ----- From: Hilliard Heather CROME, RN Sent: 02/12/2024   2:00 PM EDT To: Cain Joylene; Avelina Sprung; Ephraim Dollar* Subject: order                                          Good afternoon,  Dr Buck saw Ms Judi on 7/30 and wrote order to renew pap supplies. Please process, thanks!

## 2024-02-19 NOTE — Telephone Encounter (Signed)
 I called pt and LMVM for her to return call and also will send mychart message.  From [facility]  :    I will need OV face 2 face notes showing 30 day compliance with usage and benefit notes or patinet may need a new sleep study. Notes state patient has not used in some time.   Since not compliant then order for HST placed.  Need to relay to pt when she calls back.

## 2024-02-19 NOTE — Telephone Encounter (Signed)
HST order signed.

## 2024-02-20 NOTE — Telephone Encounter (Signed)
Called pt and mail box full.  

## 2024-02-21 ENCOUNTER — Telehealth: Payer: Self-pay | Admitting: Neurology

## 2024-02-21 NOTE — Telephone Encounter (Signed)
 HST- HTA pending

## 2024-02-26 NOTE — Telephone Encounter (Signed)
 I called and spoke to pt.  I relayed that I tried to reach her about the renew cpap supply order, and per DME requires her to have a new sleep study as pt is not compliant with her machine for 30 days and OV stating this.  Dr. Buck did order a HST for her and authorization  in process.  They will reach out to her to schedule.

## 2024-03-04 NOTE — Telephone Encounter (Signed)
 HST HTA shara: [id number] (exp. 02/21/24 to 05/21/24)

## 2024-05-08 NOTE — Telephone Encounter (Signed)
 Spoke to patient hasn't used cpap machine for over a year . Pt states waiting to repeat HST . Order was placed 02/2024 Pt states never contacted to repeat HST.Cancelled appointment for 04/2024 and sent a message to sleep lab about repeat HST

## 2024-05-09 ENCOUNTER — Ambulatory Visit: Admitting: Neurology
